# Patient Record
Sex: Female | Born: 1975 | Race: White | Hispanic: No | Marital: Married | Smoking: Never smoker
Health system: Southern US, Community
[De-identification: ages and names within clinical notes are randomized; demographics above are authoritative.]

## PROBLEM LIST (undated history)

## (undated) DIAGNOSIS — L309 Dermatitis, unspecified: Secondary | ICD-10-CM

## (undated) DIAGNOSIS — N83209 Unspecified ovarian cyst, unspecified side: Secondary | ICD-10-CM

## (undated) DIAGNOSIS — J45909 Unspecified asthma, uncomplicated: Secondary | ICD-10-CM

## (undated) DIAGNOSIS — N809 Endometriosis, unspecified: Secondary | ICD-10-CM

## (undated) DIAGNOSIS — Z87898 Personal history of other specified conditions: Secondary | ICD-10-CM

## (undated) DIAGNOSIS — N63 Unspecified lump in unspecified breast: Secondary | ICD-10-CM

## (undated) DIAGNOSIS — D219 Benign neoplasm of connective and other soft tissue, unspecified: Secondary | ICD-10-CM

## (undated) DIAGNOSIS — E079 Disorder of thyroid, unspecified: Secondary | ICD-10-CM

## (undated) HISTORY — DX: Personal history of other specified conditions: Z87.898

## (undated) HISTORY — DX: Endometriosis, unspecified: N80.9

## (undated) HISTORY — DX: Disorder of thyroid, unspecified: E07.9

## (undated) HISTORY — PX: LAPAROSCOPY: SHX197

## (undated) HISTORY — PX: MOUTH SURGERY: SHX715

## (undated) HISTORY — DX: Unspecified ovarian cyst, unspecified side: N83.209

## (undated) HISTORY — DX: Unspecified lump in unspecified breast: N63.0

## (undated) HISTORY — DX: Dermatitis, unspecified: L30.9

## (undated) HISTORY — PX: LEFT OOPHORECTOMY: SHX1961

## (undated) HISTORY — DX: Benign neoplasm of connective and other soft tissue, unspecified: D21.9

---

## 2001-07-17 ENCOUNTER — Other Ambulatory Visit: Admission: RE | Admit: 2001-07-17 | Discharge: 2001-07-17 | Payer: Self-pay | Admitting: Obstetrics and Gynecology

## 2004-09-22 ENCOUNTER — Other Ambulatory Visit: Admission: RE | Admit: 2004-09-22 | Discharge: 2004-09-22 | Payer: Self-pay | Admitting: Obstetrics and Gynecology

## 2005-03-11 ENCOUNTER — Encounter (INDEPENDENT_AMBULATORY_CARE_PROVIDER_SITE_OTHER): Payer: Self-pay | Admitting: *Deleted

## 2005-03-11 ENCOUNTER — Ambulatory Visit: Admission: RE | Admit: 2005-03-11 | Discharge: 2005-03-11 | Payer: Self-pay | Admitting: Obstetrics and Gynecology

## 2006-02-15 ENCOUNTER — Other Ambulatory Visit: Admission: RE | Admit: 2006-02-15 | Discharge: 2006-02-15 | Payer: Self-pay | Admitting: Obstetrics and Gynecology

## 2006-05-18 ENCOUNTER — Emergency Department (HOSPITAL_COMMUNITY): Admission: EM | Admit: 2006-05-18 | Discharge: 2006-05-18 | Payer: Self-pay | Admitting: Family Medicine

## 2006-08-18 ENCOUNTER — Inpatient Hospital Stay (HOSPITAL_COMMUNITY): Admission: AD | Admit: 2006-08-18 | Discharge: 2006-08-20 | Payer: Self-pay | Admitting: Obstetrics and Gynecology

## 2006-08-18 ENCOUNTER — Encounter (INDEPENDENT_AMBULATORY_CARE_PROVIDER_SITE_OTHER): Payer: Self-pay | Admitting: *Deleted

## 2007-02-28 ENCOUNTER — Other Ambulatory Visit: Admission: RE | Admit: 2007-02-28 | Discharge: 2007-02-28 | Payer: Self-pay | Admitting: Gynecology

## 2008-03-16 ENCOUNTER — Other Ambulatory Visit: Admission: RE | Admit: 2008-03-16 | Discharge: 2008-03-16 | Payer: Self-pay | Admitting: Gynecology

## 2009-01-05 ENCOUNTER — Inpatient Hospital Stay (HOSPITAL_COMMUNITY): Admission: RE | Admit: 2009-01-05 | Discharge: 2009-01-07 | Payer: Self-pay | Admitting: Obstetrics and Gynecology

## 2009-04-12 ENCOUNTER — Encounter: Admission: RE | Admit: 2009-04-12 | Discharge: 2009-04-12 | Payer: Self-pay | Admitting: Obstetrics and Gynecology

## 2009-09-03 ENCOUNTER — Encounter: Admission: RE | Admit: 2009-09-03 | Discharge: 2009-09-03 | Payer: Self-pay | Admitting: Obstetrics and Gynecology

## 2010-11-29 LAB — CBC
HCT: 37.5 % (ref 36.0–46.0)
HCT: 38.9 % (ref 36.0–46.0)
Hemoglobin: 13.7 g/dL (ref 12.0–15.0)
MCHC: 35.2 g/dL (ref 30.0–36.0)
Platelets: 173 10*3/uL (ref 150–400)
RBC: 4 MIL/uL (ref 3.87–5.11)
RDW: 12.8 % (ref 11.5–15.5)
WBC: 10.9 10*3/uL — ABNORMAL HIGH (ref 4.0–10.5)
WBC: 8.1 10*3/uL (ref 4.0–10.5)

## 2011-01-03 NOTE — Discharge Summary (Signed)
Stacy [name], Stacy [name]                 ACCOUNT NO.:  [id number]   MEDICAL RECORD NO.:  [id number]          PATIENT TYPE:  INP   LOCATION:  9131                          FACILITY:  [facility]   PHYSICIAN:  Huel Cote, M.D. DATE OF BIRTH:  May 31, 1976   DATE OF ADMISSION:  01/05/2009  DATE OF DISCHARGE:                               DISCHARGE SUMMARY   DISCHARGE DIAGNOSES:  1. Term pregnancy at 39 weeks, delivered.  2. Status post normal spontaneous vaginal delivery.   DISCHARGE MEDICATIONS:  1. Motrin 600 mg p.o. every 6 hours.  2. Percocet 1-2 tablets p.o. every 4 hours p.r.n.   DISCHARGE FOLLOWUP:  The patient is to follow up in the office in 6  weeks for her routine postpartum exam.   HOSPITAL COURSE:  The patient is 35 year old, G2, P1-0-0-1, who was  admitted at 47 plus weeks for induction of labor, given term status and  a favorable cervix.  Prenatal care was complicated by positive group B  strep status which was clindamycin resistant, but the patient was able  to tolerate cephalosporins despite her PENICILLIN allergy.  There were  no other prenatal issues.   Prenatal labs are as follows:  O positive, antibody negative, rubella  immune, hepatitis B surface antigen negative, HIV negative, GC negative,  chlamydia negative, RPR nonreactive, group B strep positive, one-hour  Glucola 81, first trimester screen normal.   PAST OBSTETRIC HISTORY:  In 2007, she had a vaginal delivery of a 7-  pound 15-ounce female infant.   PAST GYNECOLOGIC HISTORY:  She had 2 laparoscopies in 2006 and 2007 with  1 oophorectomy.   PAST MEDICAL HISTORY:  Asthma, allergies.   PAST SURGICAL HISTORY:  In 1984, she had a cyst removed from her  buttock, in 1997 was tooth removal, and in 2006 and 2007 laparoscopies  and LSO as stated.   Allergies are PENICILLIN and ERYTHROMYCIN.   On admission, she was afebrile with stable vital signs.  Fetal heart  rate was very reactive.  Cervix was 50, 3+, and a -2  station.  She had  rupture of membranes performed after she received her Ancef for group B  strep prophylaxis.  She progressed very quickly throughout the day,  reached complete dilation, and pushed great with a normal spontaneous  vaginal delivery of a vigorous female infant over a first-degree  laceration.  Apgars were 8 and 9, weight was 8 pounds 8 ounces.  There  was a nuchal cord x1 reduced over the head.  There was a small first-  degree laceration which was repaired with 2-0 Vicryl for hemostasis.  Estimated blood loss was 350 mL.  On postpartum day 1, the patient was  doing quite well.  Her only complaint were significant carpal tunnel  symptoms which had started towards the end of pregnancy and now seemed  to be slightly worse.  We discussed coping mechanism.  She was  already wrists splints.  Hemoglobin was 13.3.  Fundus was firm, bleeding  normal, and she requested an early discharge home, this was granted, and  the patient is to follow  up in our office in 6 weeks.  She may call if  her carpal tunnel symptoms do not improve in the first 2-3 weeks for  referral to a hand specialist.      Huel Cote, M.D.  Electronically Signed     KR/MEDQ  D:  01/06/2009  T:  01/06/2009  Job:  [id number]

## 2011-01-06 NOTE — Discharge Summary (Signed)
NAMENOE, PITTSLEY                 ACCOUNT NO.:  [id number]   MEDICAL RECORD NO.:  [id number]          PATIENT TYPE:  INP   LOCATION:  9105                          [facility]ACILITY:  [facility]   PHYSICIAN:  Malachi Pro. Ambrose Mantle, M.D. DATE OF BIRTH:  1975-11-04   DATE OF ADMISSION:  08/18/2006  DATE OF DISCHARGE:  08/20/2006                               DISCHARGE SUMMARY   This is 35 year old white married female para 0 gravida 1, EDC August 27, 2006 by ultrasound, admitted with severe left low-back pain and fetal  heart rate decelerations x1.  Blood group and type O positive with a  negative antibody, nonreactive serology, rubella immune, hepatitis B  surface antigen negative, HIV negative, GC and chlamydia negative.  One-  hour Glucola 113.  Group B strep negative.  Vaginal ultrasound on February 09, 2006:  Crown-rump length 4.67 cm, 11 weeks 4 days, [facility] August 27, 2006.  The patient declined screening.  Repeat ultrasound on April 02, 2006:  Average gestational age [redacted] weeks 6 days, [facility] August 28, 2006.  Prenatal care was uncomplicated.  At approximately 9:00 p.m. on the  night prior to admission, the patient had onset of left back pain just  above her left iliac crest.  Later, she was vomiting x6.  She came here,  and the fetal heart rate decelerated but then was normal.  Ultrasound  showed no abruption, and biophysical profile was 8/8.  The patient's  pain continued, so she was admitted.  She complained of contractions on  admission every 7-15 minutes.   Her past medical history revealed:  ALLERGIES TO PENICILLIN CAUSED A  RASH, E-MYCIN CAUSES GI UPSET.  Illnesses:  History of asthma.  Operations:  1984 cyst on her left buttock, 1997 wisdom teeth extracted,  2006 laparoscopy, 2007 laparoscopy with left ovary removed for  endometrioma.   PHYSICAL EXAMINATION ON ADMISSION:  Normal vital signs.  HEART/LUNGS:  Normal.  ABDOMEN:  Soft.  Fundal height 38 cm on August 15, 2006.  Fetal heart  tones  normal.  Cervix fingertip, 60%, vertex at a -2.  Artificial  rupture of the membranes produced blood tinged fluid.  Straight-leg raising test was negative bilaterally.   The patient was placed on Pitocin.  She was given heat to her low back.  Catheter urine showed no abnormalities of the urine.  By 9:48 a.m., the  Pitocin was at 6 milliunits a minute.  Contractions every 2-3 minutes.  She continued to vomit, and we checked her electrolytes which showed a  potassium of 3.5, chloride 106, carbon dioxide 21, sodium of 137.  By  Belinda Fisher, R.N., exam, the cervix was 3 cm, 100%, vertex at a -1, and  the patient requested an epidural.  The Pitocin was decreased to 4  milliunits a minute.  Cervix was 8 cm at 1:15 p.m., and by 2:18 p.m.,  the cervix was 7-8 cm.  The patient became fully dilated at 4:15 p.m.  She began pushing at 4:30 p.m., but bradycardia developed.  She was  tilted to her left side.  The  decelerations improved.  The vertex was  LOA.  The patient pushed well.  Fetal heart rate decelerated with each  contraction but had excellent recovery.  She delivered spontaneously ROA  over a second-degree midline laceration, right labial and right hymenal  lacerations by Dr. Ambrose Mantle a living female infant 7 pounds 15 ounces,  Apgars of 8 at one and 9 at five minutes.  There was mild shoulder  dystocia managed by McRoberts' and Teutsch-screw maneuvers.  Placenta was  intact.  Uterus normal.  Rectal negative.  Lacerations repaired with 2-0  and 3-0 Vicryl with 1% Xylocaine.  Blood loss about 500 mL.  Postpartum,  the patient did well and was discharged on the second postpartum day.  Initial hemoglobin 13.3, hematocrit 37.5, white count 11,700, platelet  count 195,000.  The follow-up hemoglobin was 10.6, hematocrit 30.0.  RPR  was nonreactive.  Catheter urine was negative except for 15 mg/dL  ketones.  Comprehensive metabolic profile showed no abnormalities except  alkaline phosphatase of 160,  total protein of 5.7, albumin of 2.9,  glucose of 68.   FINAL DIAGNOSES:  1. Intrauterine pregnancy at 38+ weeks delivered ROA.  2. Left back pain, unknown origin.  3. Nausea and vomiting, resolved.   OPERATIONS:  1. Spontaneous delivery ROA.  2. Repair of second-degree midline, right labial and right hymenal      lacerations.   FINAL CONDITION:  Improved.   INSTRUCTIONS:  Include our regular discharge instruction booklet.  Motrin 600 mg 30 tablets 1 every 6 hours as needed for pain is given at  discharge with 1 refill.      Malachi Pro. Ambrose Mantle, M.D.  Electronically Signed     TFH/MEDQ  D:  08/20/2006  T:  08/20/2006  Job:  [id number]

## 2011-01-06 NOTE — Op Note (Signed)
Stacy [name], Stacy [name]                 ACCOUNT NO.:  [id number]   MEDICAL RECORD NO.:  [id number]          PATIENT TYPE:  AMB   LOCATION:  DFTL                          [facility]ACILITY:  [facility]   PHYSICIAN:  Duke Salvia. Marcelle Overlie, M.D.DATE OF BIRTH:  [birth year]   DATE OF PROCEDURE:  03/11/2005  DATE OF DISCHARGE:                                 OPERATIVE REPORT   PREOPERATIVE DIAGNOSIS:  Ovarian cyst, probable endometrioma, pelvic pain.   POSTOPERATIVE DIAGNOSIS:  Pelvic endometriosis, left endometrioma.   OPERATION/PROCEDURE:  1.  Diagnostic laparoscopy with left ovarian cystotomy.  2.  Drainage of left endometrioma.  3.  Coagulation of areas of endometriosis.   SURGEON:  Duke Salvia. Marcelle Overlie, M.D.   ANESTHESIA:  General endotracheal anesthesia.   COMPLICATIONS:  None.   DRAINS:  In and out Foley catheter.   ESTIMATED BLOOD LOSS:  Minimal.   SPECIMENS:  Left tubal hydatid cyst.   DESCRIPTION OF PROCEDURE:  The patient was taken to the operating room .  General endotracheal anesthesia was obtained with the patient's legs in  stirrups, the abdomen, perineum and vagina prepped and draped in the usual  manner for laparoscopy.  Bladder was drained.  EUA carried out.  Uterus was  mid position, normal size. There was an 8 cm left ovarian cyst palpable.  Hulka tenaculum was positioned and attention directed to the abdomen where a  2 cm subumbilical incision was made after infiltrating with 0.5% Marcaine  plain.  Small incision was made and the Veress needle was introduced without  difficulty.  A central abdominal position was verified by pressure and water  testing.  After 2.5 L pneumoperitoneum was then created, laparoscopic trocar  and sleeve were introduced without difficulty.  Three fingerbreadths above  the symphysis from the midline, a 5 mm trocar was inserted after  infiltration with Marcaine.  The patient was placed in Trendelenburg and the  pelvic findings were as follows:   The uterus  itself was normal size.  In the anterior space, there was  significant areas of diffuse endometriosis. The right ovary was normal  except for some superficial endometriosis as was the right tube.  The areas  along the surface of the ovary were coagulated with bipolar.  The left tube  was normal with a normal fimbriated end except for a small hydatid cyst  which was coagulated and removed.  The left ovary was enlarged with an 8 cm  smooth-walled cyst.  It was not adherent to the cul-de-sac.  On elevation  there were some areas of endometriosis above the uterosacral ligaments  behind the uterus well defined.  After coagulating the surface of the left  ovary, small incision was made releasing dark blood. This was not quite a  chocolate cyst but it was felt to represent either a corpus luteum cyst with  old blood or endometrioma.  This was deflated and aspirated completely.  The  suction irrigator was then placed inside the incision to irrigate the cyst  thoroughly.  The cyst was too large to effectively strip the cavity but she  will require postoperative  Lupron regardless.  After this was completed, the  right ovarian superficial endometriosis was coagulated.  The cyst on the  left was completely deflated and was hemostatic.  Pelvis was irrigated  thoroughly and aspirated and 30-50 mL of saline was floated in the cul-de-  sac in an attempt to try prevent adhesions.  Instruments were removed, gas  allowed to escape.  The deep fascia was closed with 4-0 Dexon subcuticular  sutures and Dermabond.  She tolerated this well and went to the recovery  room in good condition.       RMH/MEDQ  D:  03/11/2005  T:  03/11/2005  Job:  [id number]

## 2011-01-06 NOTE — H&P (Signed)
NAMEMELVINA, PANGELINAN                 ACCOUNT NO.:  [id number]   MEDICAL RECORD NO.:  [id number]           PATIENT TYPE:   LOCATION:                                 FACILITY:   PHYSICIAN:  Duke Salvia. Marcelle Overlie, M.D.    DATE OF BIRTH:   DATE OF ADMISSION:  03/11/2005  DATE OF DISCHARGE:                                HISTORY & PHYSICAL   CHIEF COMPLAINT:  Severe pelvic pain.   HISTORY OF PRESENT ILLNESS:  A 35 year old G0, P0.  Patient currently using  condoms for contraception.  I saw this patient originally in 1996 at which  time she was 18, having irregular cycles, and was started on Lo-Estrin at  that point.  She was seen again in 1999 as a college student, doing well on  her OCPs and again in 2002 on Lo-Ovral, doing well.   I had not seen her in four years.  She was under the care of Dr. 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  When I  saw her September 22, 2004 she had come off of her Yasmin and was ready to try  to get pregnant at that time.  She was told that she may have some degree of  PCOS and may require Clomid for ovulation induction.  There is no family  history of endometriosis.  Laboratory work done September 22, 2004 showed  normal thyroid profile, FSH 4.1, progesterone 7.8, prolactin 12.8.  That was  a mid luteal progesterone.  She was ovulating at that time.   She saw her PCP, Dr. Stacie Acres, last month complaining of severe pelvic pain and  a CT was ordered that was dated February 22, 2005 that showed a multiloculated  pelvic cyst, most likely hemorrhagic cyst or endometrioma.  There was no  free fluid noted.  At that time the appendix was identified and appeared to  be normal.  There was no other CT evidence of appendicitis and her white  count was 6000.  She was given narcotic pain medicines with the plan to  follow up ultrasound after her next cycle to see if this resolved.   In the interim her pain has worsened so a follow-up  ultrasound was done March 09, 2005 that showed several small follicle cysts in the right ovary.  Left  ovary, however, was enlarged 8.1 x 5.9 x 4.8 with some diffuse internal  echoes, appearing to be more of an endometrial or hemorrhagic CLC versus  other possibilities such as cystadenoma or a dermoid.  Because of the  severity of the pain she presents now for a diagnostic laparoscopy with  possible laparotomy.  This procedure was discussed at great length with her  including the need to remain conservative because of her pregnancy concerns.  Specifically, will start with laparoscopy.  If significant endometriosis is  encountered we may be able to drain the endometrioma laparoscopically and  treat her with postoperative Lupron.  If the cyst is more self-contained  such as dermoid and does not appear to be adherent,  we discussed laparotomy  with cystectomy versus the possibility of oophorectomy which she  understands.  This procedure including risk of bleeding, infection,  transfusion, adjacent organ injury, the possible need for open or additional  surgery along with impact on her fertility, and expected recovery time all  reviewed with her which she understands and accepts.   PAST MEDICAL HISTORY:   ALLERGIES:  PENICILLIN, ERYTHROMYCIN.   CURRENT MEDICATIONS:  Vicoprofen or Motrin p.r.n.   FAMILY HISTORY:  Significant for mother with mitral valve prolapse, a  brother with asthma, otherwise unremarkable.   PHYSICAL EXAMINATION:  VITAL SIGNS:  Temperature 98.6, blood pressure  120/72.  HEENT:  Unremarkable.  NECK:  Supple without masses.  LUNGS:  Clear.  CARDIOVASCULAR:  Regular rate and rhythm without murmurs, rubs, or gallops  noted.  BREASTS:  Without masses.  ABDOMEN:  Soft, flat, nontender.  No rebound.  Normal bowel sounds.  PELVIC:  Vulva, vagina, cervix was normal.  Last Pap dated February 2006 was  normal.  Uterus itself was anterior, normal size, nontender.  The right   adnexal area is unremarkable.  Posterior and to the left was a moderately  tender ovarian cyst as noted by ultrasound.  No unusual nodularity.  EXTREMITIES:  Unremarkable.  NEUROLOGIC:  Unremarkable.   IMPRESSION:  Ovarian cyst, acute pelvic pain.   PLAN:  Laparoscopy with possible laparotomy and USO as discussed above.  Will also plan for preoperative bowel prep which was discussed with her.       RMH/MEDQ  D:  03/09/2005  T:  03/09/2005  Job:  [id number]

## 2011-07-21 ENCOUNTER — Other Ambulatory Visit (HOSPITAL_COMMUNITY): Payer: Self-pay | Admitting: Gynecology

## 2011-07-21 DIAGNOSIS — N979 Female infertility, unspecified: Secondary | ICD-10-CM

## 2011-07-27 ENCOUNTER — Ambulatory Visit (HOSPITAL_COMMUNITY)
Admission: RE | Admit: 2011-07-27 | Discharge: 2011-07-27 | Disposition: A | Discharge status: Home / Self Care | Payer: BC Managed Care – PPO | Source: Ambulatory Visit | Attending: Gynecology | Admitting: Gynecology

## 2011-07-27 DIAGNOSIS — N979 Female infertility, unspecified: Secondary | ICD-10-CM

## 2011-07-27 MED ORDER — IOHEXOL 300 MG/ML  SOLN: 20.0000 mL | Freq: Once | INTRAMUSCULAR | Status: AC | PRN

## 2011-08-22 NOTE — L&D Delivery Note (Signed)
Delivery Note At 10:01 AM a viable female was delivered via Vaginal, Spontaneous Delivery (Presentation: ; Occiput Anterior, to LOT).  APGAR: 8, 9; weight P .   Placenta status: Intact, Spontaneous.  Cord: 3 vessels with the following complications: None.  Baby delivered with mild, <30seconds shoulder dystocia, relieved with McRoberts, Suprapubic, Posterior arm manipulation  Anesthesia: Epidural  Episiotomy: None Lacerations: 1st degree;Perineal Suture Repair: 3.0 vicryl rapide Est. Blood Loss (mL): 400  Mom to postpartum.  Baby to stay with mommy and daddy.  [name],Stacy Getchell 06/19/2012, 10:22 AM  A+/ BR/ RI/

## 2011-12-15 LAB — OB RESULTS CONSOLE GBS: GBS: POSITIVE

## 2011-12-15 LAB — OB RESULTS CONSOLE ABO/RH: "RH Type ": POSITIVE

## 2011-12-15 LAB — OB RESULTS CONSOLE ANTIBODY SCREEN: Antibody Screen: NEGATIVE

## 2011-12-15 LAB — OB RESULTS CONSOLE HIV ANTIBODY (ROUTINE TESTING): HIV: NONREACTIVE

## 2011-12-15 LAB — OB RESULTS CONSOLE RUBELLA ANTIBODY, IGM: Rubella: IMMUNE

## 2011-12-15 LAB — OB RESULTS CONSOLE GC/CHLAMYDIA: Chlamydia: NEGATIVE

## 2012-02-15 ENCOUNTER — Other Ambulatory Visit: Payer: Self-pay | Admitting: Obstetrics and Gynecology

## 2012-02-15 DIAGNOSIS — N63 Unspecified lump in unspecified breast: Secondary | ICD-10-CM

## 2012-02-16 ENCOUNTER — Ambulatory Visit
Admission: RE | Admit: 2012-02-16 | Discharge: 2012-02-16 | Disposition: A | Discharge status: Home / Self Care | Payer: BC Managed Care – PPO | Source: Ambulatory Visit | Attending: Obstetrics and Gynecology | Admitting: Obstetrics and Gynecology

## 2012-02-16 DIAGNOSIS — N63 Unspecified lump in unspecified breast: Secondary | ICD-10-CM

## 2012-06-11 ENCOUNTER — Encounter (HOSPITAL_COMMUNITY): Payer: Self-pay | Admitting: *Deleted

## 2012-06-11 ENCOUNTER — Telehealth (HOSPITAL_COMMUNITY): Payer: Self-pay | Admitting: *Deleted

## 2012-06-11 NOTE — Telephone Encounter (Signed)
Preadmission screen  

## 2012-06-14 ENCOUNTER — Inpatient Hospital Stay (HOSPITAL_COMMUNITY)
Admission: AD | Admit: 2012-06-14 | Discharge: 2012-06-15 | Disposition: A | Discharge status: Home / Self Care | Payer: BC Managed Care – PPO | Source: Ambulatory Visit | Attending: Obstetrics and Gynecology | Admitting: Obstetrics and Gynecology

## 2012-06-14 ENCOUNTER — Telehealth (HOSPITAL_COMMUNITY): Payer: Self-pay | Admitting: *Deleted

## 2012-06-14 ENCOUNTER — Encounter (HOSPITAL_COMMUNITY): Payer: Self-pay | Admitting: *Deleted

## 2012-06-14 ENCOUNTER — Encounter (HOSPITAL_COMMUNITY): Payer: Self-pay

## 2012-06-14 DIAGNOSIS — O479 False labor, unspecified: Secondary | ICD-10-CM | POA: Insufficient documentation

## 2012-06-14 HISTORY — DX: Unspecified asthma, uncomplicated: J45.909

## 2012-06-14 NOTE — Telephone Encounter (Signed)
Preadmission screen  

## 2012-06-14 NOTE — Progress Notes (Signed)
Dr Ellyn Hack notified of patient, tracing, ctx pattern, sve result. Order to offer patient to ambulate for an hour and recheck or discharge home.

## 2012-06-19 ENCOUNTER — Encounter (HOSPITAL_COMMUNITY): Payer: Self-pay | Admitting: Anesthesiology

## 2012-06-19 ENCOUNTER — Encounter (HOSPITAL_COMMUNITY): Payer: Self-pay | Admitting: *Deleted

## 2012-06-19 ENCOUNTER — Inpatient Hospital Stay (HOSPITAL_COMMUNITY)
Admission: AD | Admit: 2012-06-19 | Discharge: 2012-06-21 | DRG: 373 | Disposition: A | Discharge status: Home / Self Care | Payer: BC Managed Care – PPO | Source: Ambulatory Visit | Attending: Obstetrics and Gynecology | Admitting: Obstetrics and Gynecology

## 2012-06-19 ENCOUNTER — Inpatient Hospital Stay (HOSPITAL_COMMUNITY): Payer: BC Managed Care – PPO | Admitting: Anesthesiology

## 2012-06-19 DIAGNOSIS — O99892 Other specified diseases and conditions complicating childbirth: Secondary | ICD-10-CM | POA: Diagnosis present

## 2012-06-19 DIAGNOSIS — Z3490 Encounter for supervision of normal pregnancy, unspecified, unspecified trimester: Secondary | ICD-10-CM

## 2012-06-19 DIAGNOSIS — Z22330 Carrier of Group B streptococcus: Secondary | ICD-10-CM

## 2012-06-19 LAB — CBC
Hemoglobin: 13.5 g/dL (ref 12.0–15.0)
RBC: 4.3 MIL/uL (ref 3.87–5.11)
WBC: 11.7 10*3/uL — ABNORMAL HIGH (ref 4.0–10.5)

## 2012-06-19 LAB — ABO/RH: ABO/RH(D): O POS

## 2012-06-19 LAB — RPR: RPR Ser Ql: NONREACTIVE

## 2012-06-19 MED ORDER — DIPHENHYDRAMINE HCL 50 MG/ML IJ SOLN: 12.5000 mg | INTRAMUSCULAR | Status: DC | PRN

## 2012-06-19 MED ORDER — PRENATAL MULTIVITAMIN CH
1.0000 | ORAL_TABLET | Freq: Every day | ORAL | Status: DC
  Administered 2012-06-19: 1 via ORAL

## 2012-06-19 MED ORDER — WITCH HAZEL-GLYCERIN EX PADS: 1.0000 "application " | MEDICATED_PAD | CUTANEOUS | Status: DC | PRN

## 2012-06-19 MED ORDER — LACTATED RINGERS IV SOLN: INTRAVENOUS | Status: DC

## 2012-06-19 MED ORDER — ONDANSETRON HCL 4 MG PO TABS
4.0000 mg | ORAL_TABLET | ORAL | Status: DC | PRN
Start: 2012-06-19 — End: 2012-06-21

## 2012-06-19 MED ORDER — LIDOCAINE HCL (PF) 1 % IJ SOLN
INTRAMUSCULAR | Status: DC | PRN
  Administered 2012-06-19 (×2): 5 mL

## 2012-06-19 MED ORDER — DIBUCAINE 1 % RE OINT: 1.0000 "application " | TOPICAL_OINTMENT | RECTAL | Status: DC | PRN

## 2012-06-19 MED ORDER — IBUPROFEN 600 MG PO TABS
600.0000 mg | ORAL_TABLET | Freq: Four times a day (QID) | ORAL | Status: DC
  Administered 2012-06-19 – 2012-06-21 (×8): 600 mg via ORAL
  Filled 2012-06-19 (×8): qty 1

## 2012-06-19 MED ORDER — OXYTOCIN 40 UNITS IN LACTATED RINGERS INFUSION - SIMPLE MED
62.5000 mL/h | INTRAVENOUS | Status: DC
  Administered 2012-06-19: 62.5 mL/h via INTRAVENOUS
  Administered 2012-06-19: 999 mL/h via INTRAVENOUS
  Filled 2012-06-19: qty 1000

## 2012-06-19 MED ORDER — CITRIC ACID-SODIUM CITRATE 334-500 MG/5ML PO SOLN: 30.0000 mL | ORAL | Status: DC | PRN

## 2012-06-19 MED ORDER — PRENATAL MULTIVITAMIN CH
1.0000 | ORAL_TABLET | Freq: Every day | ORAL | Status: DC
  Administered 2012-06-19 – 2012-06-21 (×3): 1 via ORAL
  Filled 2012-06-19 (×3): qty 1

## 2012-06-19 MED ORDER — EPHEDRINE 5 MG/ML INJ
INTRAVENOUS | Status: AC
  Filled 2012-06-19: qty 4

## 2012-06-19 MED ORDER — SIMETHICONE 80 MG PO CHEW: 80.0000 mg | CHEWABLE_TABLET | ORAL | Status: DC | PRN

## 2012-06-19 MED ORDER — OXYCODONE-ACETAMINOPHEN 5-325 MG PO TABS: 1.0000 | ORAL_TABLET | ORAL | Status: DC | PRN

## 2012-06-19 MED ORDER — BENZOCAINE-MENTHOL 20-0.5 % EX AERO: 1.0000 "application " | INHALATION_SPRAY | CUTANEOUS | Status: DC | PRN

## 2012-06-19 MED ORDER — IBUPROFEN 600 MG PO TABS: 600.0000 mg | ORAL_TABLET | Freq: Four times a day (QID) | ORAL | Status: DC | PRN

## 2012-06-19 MED ORDER — PHENYLEPHRINE 40 MCG/ML (10ML) SYRINGE FOR IV PUSH (FOR BLOOD PRESSURE SUPPORT)
PREFILLED_SYRINGE | INTRAVENOUS | Status: AC
  Filled 2012-06-19: qty 5

## 2012-06-19 MED ORDER — ONDANSETRON HCL 4 MG/2ML IJ SOLN: 4.0000 mg | Freq: Four times a day (QID) | INTRAMUSCULAR | Status: DC | PRN

## 2012-06-19 MED ORDER — ZOLPIDEM TARTRATE 5 MG PO TABS
5.0000 mg | ORAL_TABLET | Freq: Every evening | ORAL | Status: DC | PRN
Start: 2012-06-19 — End: 2012-06-21

## 2012-06-19 MED ORDER — EPHEDRINE 5 MG/ML INJ: 10.0000 mg | INTRAVENOUS | Status: DC | PRN

## 2012-06-19 MED ORDER — MUPIROCIN CALCIUM 2 % NA OINT
1.0000 "application " | TOPICAL_OINTMENT | NASAL | Status: DC
  Filled 2012-06-19 (×2): qty 1

## 2012-06-19 MED ORDER — PHENYLEPHRINE 40 MCG/ML (10ML) SYRINGE FOR IV PUSH (FOR BLOOD PRESSURE SUPPORT): 80.0000 ug | PREFILLED_SYRINGE | INTRAVENOUS | Status: DC | PRN

## 2012-06-19 MED ORDER — CLINDAMYCIN PHOSPHATE 900 MG/50ML IV SOLN
900.0000 mg | Freq: Once | INTRAVENOUS | Status: AC
  Administered 2012-06-19: 900 mg via INTRAVENOUS
  Filled 2012-06-19: qty 50

## 2012-06-19 MED ORDER — LANOLIN HYDROUS EX OINT: TOPICAL_OINTMENT | CUTANEOUS | Status: DC | PRN

## 2012-06-19 MED ORDER — FENTANYL 2.5 MCG/ML BUPIVACAINE 1/10 % EPIDURAL INFUSION (WH - ANES)
14.0000 mL/h | INTRAMUSCULAR | Status: DC
  Administered 2012-06-19: 14 mL/h via EPIDURAL
  Filled 2012-06-19: qty 125

## 2012-06-19 MED ORDER — ONDANSETRON HCL 4 MG/2ML IJ SOLN: 4.0000 mg | INTRAMUSCULAR | Status: DC | PRN

## 2012-06-19 MED ORDER — SENNOSIDES-DOCUSATE SODIUM 8.6-50 MG PO TABS
2.0000 | ORAL_TABLET | Freq: Every day | ORAL | Status: DC
  Administered 2012-06-19 – 2012-06-20 (×2): 2 via ORAL

## 2012-06-19 MED ORDER — LACTATED RINGERS IV SOLN: 500.0000 mL | Freq: Once | INTRAVENOUS | Status: DC

## 2012-06-19 MED ORDER — FENTANYL 2.5 MCG/ML BUPIVACAINE 1/10 % EPIDURAL INFUSION (WH - ANES)
INTRAMUSCULAR | Status: AC
  Filled 2012-06-19: qty 125

## 2012-06-19 MED ORDER — LEVOTHYROXINE SODIUM 25 MCG PO TABS: 25.0000 ug | ORAL_TABLET | Freq: Every day | ORAL | Status: DC

## 2012-06-19 MED ORDER — LACTATED RINGERS IV SOLN: 500.0000 mL | INTRAVENOUS | Status: DC | PRN

## 2012-06-19 MED ORDER — ACETAMINOPHEN 325 MG PO TABS: 650.0000 mg | ORAL_TABLET | ORAL | Status: DC | PRN

## 2012-06-19 MED ORDER — LIDOCAINE HCL (PF) 1 % IJ SOLN
30.0000 mL | INTRAMUSCULAR | Status: DC | PRN
  Filled 2012-06-19: qty 30

## 2012-06-19 MED ORDER — OXYTOCIN BOLUS FROM INFUSION
500.0000 mL | Freq: Once | INTRAVENOUS | Status: DC
  Filled 2012-06-19: qty 500

## 2012-06-19 MED ORDER — DIPHENHYDRAMINE HCL 25 MG PO CAPS: 25.0000 mg | ORAL_CAPSULE | Freq: Four times a day (QID) | ORAL | Status: DC | PRN

## 2012-06-19 NOTE — Anesthesia Preprocedure Evaluation (Signed)
Anesthesia Evaluation  Patient identified by MRN, date of birth, ID band Patient awake    Reviewed: Allergy & Precautions, H&P , Patient's Chart, lab work & pertinent test results  Airway Mallampati: II TM Distance: >3 FB Neck ROM: full    Dental No notable dental hx.    Pulmonary neg pulmonary ROS, asthma ,  breath sounds clear to auscultation  Pulmonary exam normal       Cardiovascular negative cardio ROS  Rhythm:regular Rate:Normal     Neuro/Psych negative neurological ROS  negative psych ROS   GI/Hepatic negative GI ROS, Neg liver ROS,   Endo/Other  negative endocrine ROS  Renal/GU negative Renal ROS     Musculoskeletal   Abdominal   Peds  Hematology negative hematology ROS (+)   Anesthesia Other Findings Eczema     Fibroid   endometriomas x3    Endometriosis     Hx of mastitis        Breast mass   under R arm benign [place] x3 still there Ovarian cyst        Thyroid disease   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     Plan Discussed with:   Anesthesia Plan Comments:         Anesthesia Quick Evaluation

## 2012-06-19 NOTE — Anesthesia Procedure Notes (Signed)
Epidural Patient location during procedure: OB Start time: 06/19/2012 9:41 AM  Staffing Anesthesiologist: Brayton Caves R Performed by: anesthesiologist   Preanesthetic Checklist Completed: patient identified, site marked, surgical consent, pre-op evaluation, timeout performed, IV checked, risks and benefits discussed and monitors and equipment checked  Epidural Patient position: sitting Prep: site prepped and draped and DuraPrep Patient monitoring: continuous pulse ox and blood pressure Approach: midline Injection technique: LOR air and LOR saline  Needle:  Needle type: Tuohy  Needle gauge: 17 G Needle length: 9 cm and 9 Needle insertion depth: 5 cm cm Catheter type: closed end flexible Catheter size: 19 Gauge Catheter at skin depth: 10 cm Test dose: negative  Assessment Events: blood not aspirated, injection not painful, no injection resistance, negative IV test and no paresthesia  Additional Notes Patient identified.  Risk benefits discussed including failed block, incomplete pain control, headache, nerve damage, paralysis, blood pressure changes, nausea, vomiting, reactions to medication both toxic or allergic, and postpartum back pain.  Patient expressed understanding and wished to proceed.  All questions were answered.  Sterile technique used throughout procedure and epidural site dressed with sterile barrier dressing. No paresthesia or other complications noted.The patient did not experience any signs of intravascular injection such as tinnitus or metallic taste in mouth nor signs of intrathecal spread such as rapid motor block. Please see nursing notes for vital signs.

## 2012-06-19 NOTE — MAU Note (Signed)
Pt states U/C's started to get more intense about 0445 this am.  No Vaginal bleeding or ROM.  Good fetal movement.

## 2012-06-19 NOTE — H&P (Signed)
Stacy [name] is a 36 y.o. female G3P2002 at 39+ in active labor, presents in advanced labor.  GBBS + will get prophylaxis.  Will attempt to place epidural.  Uncomplicated PNC except Carpal tunnel and GBBS+ Maternal Medical History:  Reason for admission: Reason for admission: contractions.  Contractions: Frequency: regular.    Fetal activity: Perceived fetal activity is normal.      OB History    Grav Para Term Preterm Abortions TAB SAB Ect Mult Living   3 2 2       2     G1 TSVD 7#15, G2 TSVD 8#8, G3 present; no abnormal pap, no STDs Past Medical History  Diagnosis Date  . Eczema   . Fibroid     endometriomas x3  . Endometriosis   . Hx of mastitis   . Breast mass     under R arm benign [place] x3 still there  . Ovarian cyst   . 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 Asthma    Past Surgical History  Procedure Date  . Mouth surgery   . Laparoscopy   . Left oophorectomy    Family History: family history includes Alcohol abuse in her brother, maternal grandfather, paternal grandfather, and paternal uncle; COPD in her paternal grandmother; Cancer in her paternal grandmother; Diverticulitis in her maternal grandfather; Endometriosis in her maternal aunt; Fibroids in her paternal grandmother; Heart disease in her maternal grandfather; Hyperlipidemia in her mother; Osteopenia in her mother; Other in her father; and Parkinson's disease in her maternal grandfather. Social History:  reports that she has never smoked. She has never used smokeless tobacco. She reports that she does not drink alcohol or use illicit drugs. Doctor, general practice, married Meds Claritin, PNV, Synthroid All PCN, Erythromycin, no latex    Prenatal Transfer Tool  Maternal Diabetes: No Genetic Screening: Normal Maternal Ultrasounds/Referrals: Normal Fetal Ultrasounds or other Referrals:  None Maternal Substance Abuse:  No Significant Maternal Medications:  Meds include: Other: PNV, Claritin,  Synthroid Significant Maternal Lab Results:  Lab values include: Group B Strep positive Other Comments:  maternal hypothyroid  Review of Systems  Constitutional: Negative.   HENT: Negative.   Eyes: Negative.   Respiratory: Negative.   Cardiovascular: Negative.   Gastrointestinal: Negative.   Genitourinary: Negative.   Musculoskeletal: Negative.   Skin: Negative.   Neurological: Negative.   Psychiatric/Behavioral: Negative.     Dilation: 8 Effacement (%): 90 Station: -1 Exam by:: L. Paschal, RN Blood pressure 115/79, pulse 71, temperature 97.7 F (36.5 C), temperature source Oral, resp. rate 18, height 5\' 3"  (1.6 m), weight 69.4 kg (153 lb), last menstrual period 09/17/2011, SpO2 100.00%. Maternal Exam:  Uterine Assessment: Contraction strength is firm.  Contraction frequency is regular.   Abdomen: Fundal height is appropriate for gestation.   Estimated fetal weight is 8-9#.   Fetal presentation: vertex  Introitus: Normal vulva. Normal vagina.  Pelvis: adequate for delivery.      Physical Exam  Constitutional: She is oriented to person, place, and time. She appears well-developed and well-nourished.  HENT:  Head: Normocephalic.  Neck: Normal range of motion. Neck supple.  Cardiovascular: Normal rate and regular rhythm.   Respiratory: Effort normal and breath sounds normal. No respiratory distress.  GI: Soft. Bowel sounds are normal. There is no tenderness.  Musculoskeletal: Normal range of motion.  Neurological: She is alert and oriented to person, place, and time.  Skin: Skin is warm and dry.  Psychiatric: She has a normal mood  and affect. Her behavior is normal.    Prenatal labs: ABO, Rh: O/Positive/-- (04/26 0000) Antibody: Negative (04/26 0000) Rubella: Immune (04/26 0000) RPR: Nonreactive (04/26 0000)  HBsAg: Negative (04/26 0000)  HIV: Non-reactive (04/26 0000)  GBS: Positive (04/26 0000)  Hgb 13.5/ Pap WNL HR HPV neg/ Plt 237K/ GC neg/ Chl neg/ First Tri  and AFP WNL/ glucola 96/ TSH WNL/ GBBS +  U/S cwd, nl NT, post plac. Nl anat, post plac, female  Tdap/Flu - 9/25  Assessment/Plan: 16XW [id number] with advanced cervical dilitation/ advanced labor Admit, Clinda for gbbs prophylaxis Try for epidural Expect SVD   BOVARD,Shloime Keilman 06/19/2012, 8:40 AM

## 2012-06-19 NOTE — MAU Note (Signed)
Patient states she is having contractions every 3 minutes. Has had spotting a few days ago, no actively bleeding or leaking.

## 2012-06-20 LAB — CBC
Platelets: 163 10*3/uL (ref 150–400)
RDW: 11.9 % (ref 11.5–15.5)
WBC: 10.2 10*3/uL (ref 4.0–10.5)

## 2012-06-20 MED ORDER — LEVOTHYROXINE SODIUM 25 MCG PO TABS
25.0000 ug | ORAL_TABLET | Freq: Every day | ORAL | Status: DC
  Administered 2012-06-21: 25 ug via ORAL
  Filled 2012-06-20: qty 1

## 2012-06-20 NOTE — Anesthesia Postprocedure Evaluation (Signed)
  Anesthesia Post-op Note  Patient: Stacy [name]  Procedure(s) Performed: * No procedures listed *  Patient Location: Mother/Baby  Anesthesia Type:Epidural  Level of Consciousness: awake, alert  and oriented  Airway and Oxygen Therapy: Patient Spontanous Breathing  Post-op Pain: none  Post-op Assessment: Patient's Cardiovascular Status Stable, Respiratory Function Stable, Patent Airway, No signs of Nausea or vomiting, Adequate PO intake, Pain level controlled, No headache, No backache, No residual numbness and No residual motor weakness  Post-op Vital Signs: Reviewed and stable  Complications: No apparent anesthesia complications

## 2012-06-20 NOTE — Progress Notes (Signed)
Post Partum Day 1 Subjective: no complaints, voiding, tolerating PO and nl lochia, pain controlled  Objective: Blood pressure 120/78, pulse 66, temperature 98.2 F (36.8 C), temperature source Oral, resp. rate 16, height 5\' 3"  (1.6 m), weight 69.4 kg (153 lb), last menstrual period 09/17/2011, SpO2 99.00%, unknown if currently breastfeeding.  Physical Exam:  General: alert and no distress Lochia: appropriate Uterine Fundus: firm   Basename 06/20/12 0515 06/19/12 0835  HGB 12.3 13.5  HCT 34.9* 38.3    Assessment/Plan: Plan for discharge tomorrow, Breastfeeding and Lactation consult   LOS: 1 day   BOVARD,Tyriq Moragne 06/20/2012, 8:41 AM

## 2012-06-21 ENCOUNTER — Inpatient Hospital Stay (HOSPITAL_COMMUNITY): Admission: RE | Admit: 2012-06-21 | Payer: BC Managed Care – PPO | Source: Ambulatory Visit

## 2012-06-21 MED ORDER — PRENATAL MULTIVITAMIN CH: 1.0000 | ORAL_TABLET | Freq: Every day | ORAL | Status: DC

## 2012-06-21 MED ORDER — IBUPROFEN 800 MG PO TABS: 800.0000 mg | ORAL_TABLET | Freq: Three times a day (TID) | ORAL | Status: AC | PRN

## 2012-06-21 MED ORDER — OXYCODONE-ACETAMINOPHEN 5-325 MG PO TABS: 1.0000 | ORAL_TABLET | Freq: Four times a day (QID) | ORAL | Status: DC | PRN

## 2012-06-21 NOTE — Discharge Summary (Signed)
Obstetric Discharge Summary Reason for Admission: onset of labor Prenatal Procedures: none Intrapartum Procedures: spontaneous vaginal delivery, mild shoulder dystocia Postpartum Procedures: none Complications-Operative and Postpartum: 1st degree perineal laceration Hemoglobin  Date Value Range Status  06/20/2012 12.3  12.0 - 15.0 g/dL Final     HCT  Date Value Range Status  06/20/2012 34.9* 36.0 - 46.0 % Final    Physical Exam:  General: alert and no distress Lochia: appropriate Uterine Fundus: firm  Discharge Diagnoses: Term Pregnancy-delivered  Discharge Information: Date: 06/21/2012 Activity: pelvic rest Diet: routine Medications: PNV, Ibuprofen and Percocet Condition: improved Instructions: refer to practice specific booklet Discharge to: home Follow-up Information    Follow up with BOVARD,Merlon Alcorta, MD. Schedule an appointment as soon as possible for a visit in 6 weeks.   Contact information:   [address] [place] [address] [phone]          Newborn Data: Live born female  Birth Weight: 8 lb 10.3 oz (3920 g) APGAR: 8, 9  Home with mother.  BOVARD,Dermot Gremillion 06/21/2012, 8:59 AM

## 2012-06-21 NOTE — Progress Notes (Signed)
Post Partum Day 2 Subjective: no complaints, voiding, tolerating PO and nl lochia, pain controlled  Objective: Blood pressure 103/67, pulse 68, temperature 97.5 F (36.4 C), temperature source Oral, resp. rate 16, height 5\' 3"  (1.6 m), weight 69.4 kg (153 lb), last menstrual period 09/17/2011, SpO2 97.00%, unknown if currently breastfeeding.  Physical Exam:  General: alert and no distress Lochia: appropriate Uterine Fundus: firm   Basename 06/20/12 0515 06/19/12 0835  HGB 12.3 13.5  HCT 34.9* 38.3    Assessment/Plan: Discharge home, Breastfeeding and Lactation consult, d/c with motrin, percocet and pnv, f/u 6 weeks   LOS: 2 days   BOVARD,Journe Hallmark 06/21/2012, 8:53 AM

## 2012-06-24 ENCOUNTER — Inpatient Hospital Stay (HOSPITAL_COMMUNITY): Payer: BC Managed Care – PPO

## 2013-10-05 IMAGING — RF DG HYSTEROGRAM
8 series · 8 of 8 positions shown · non-contrast
Comparison: none

CLINICAL DATA: Infertility.  Assess tubal patency

HYSTEROSALPINGOGRAM
TECHNIQUE: Hysterosalpingogram was performed by the ordering
physician under fluoroscopy.  Fluoroscopic images are submitted for
interpretation following the procedure.
Fluoroscopy Time:  2.0 minutes.

[Series 1: run · 1 of 1 slices shown (1 of 8)]
[im 1/1]
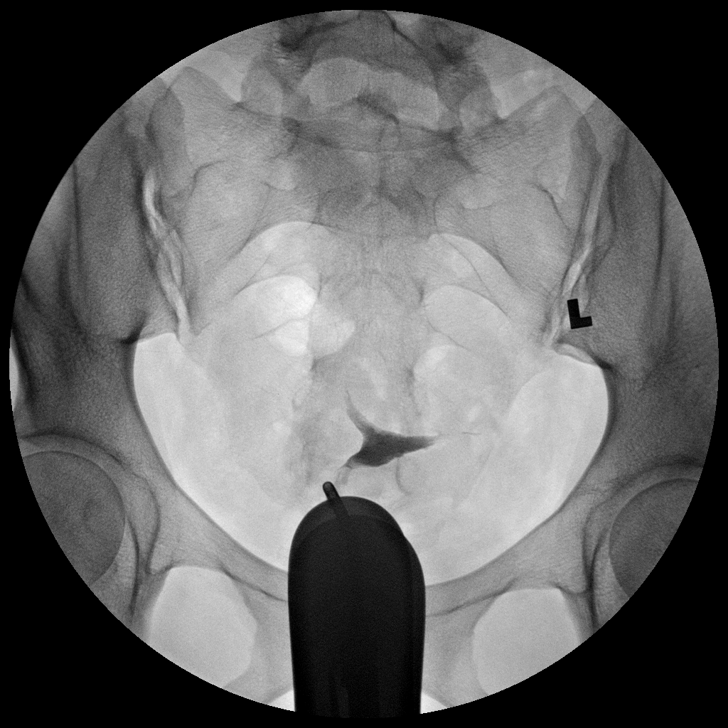

[Series 2: run · 1 of 1 slices shown (2 of 8)]
[im 1/1]
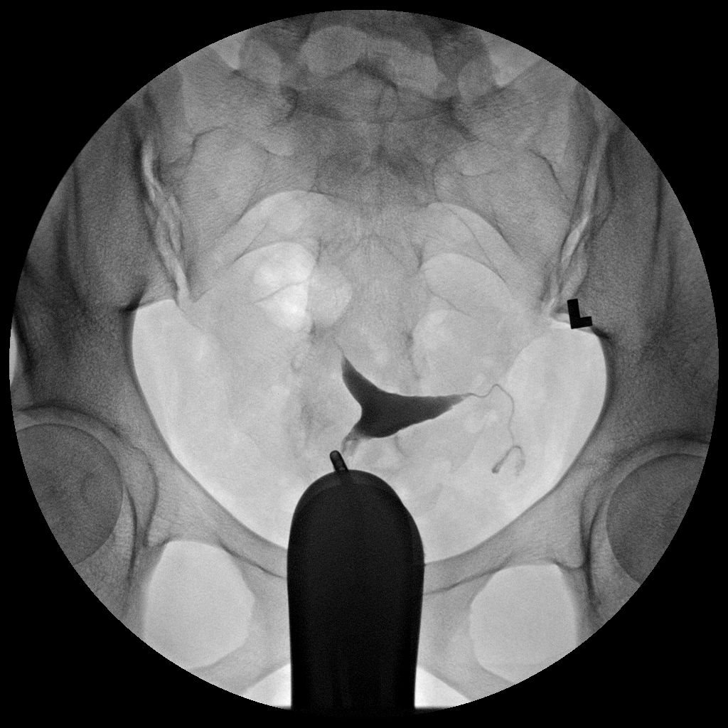

[Series 3: run · 1 of 1 slices shown (3 of 8)]
[im 1/1]
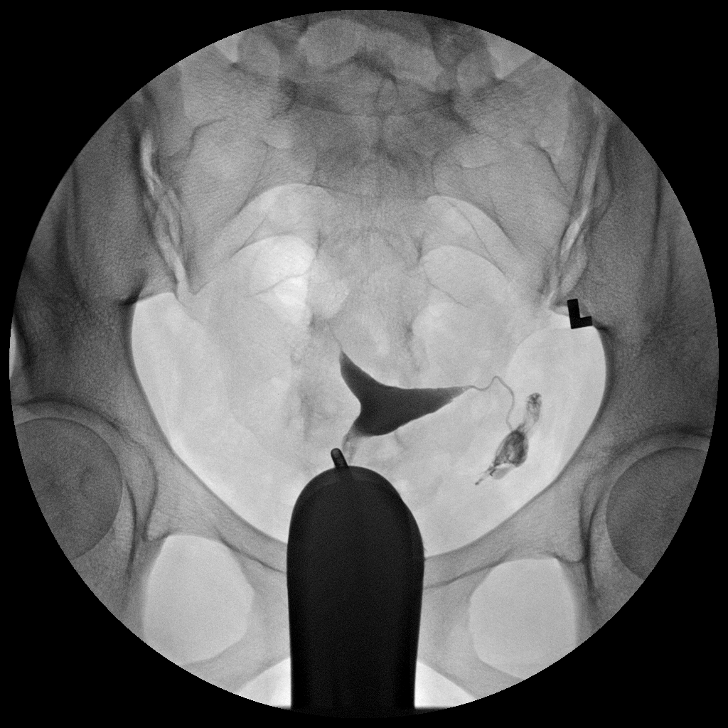

[Series 4: run · 1 of 1 slices shown (4 of 8)]
[im 1/1]
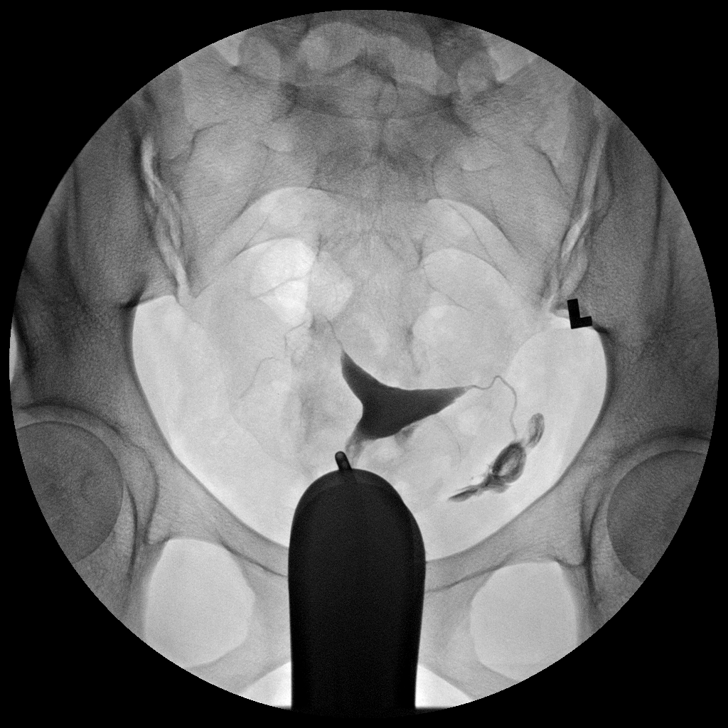

[Series 5: run · 1 of 1 slices shown (5 of 8)]
[im 1/1]
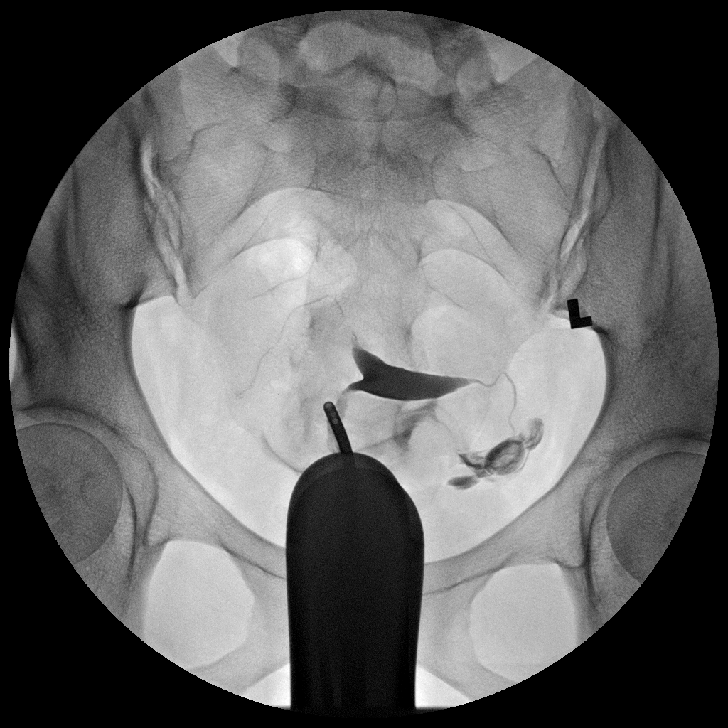

[Series 6: run · 1 of 1 slices shown (6 of 8)]
[im 1/1]
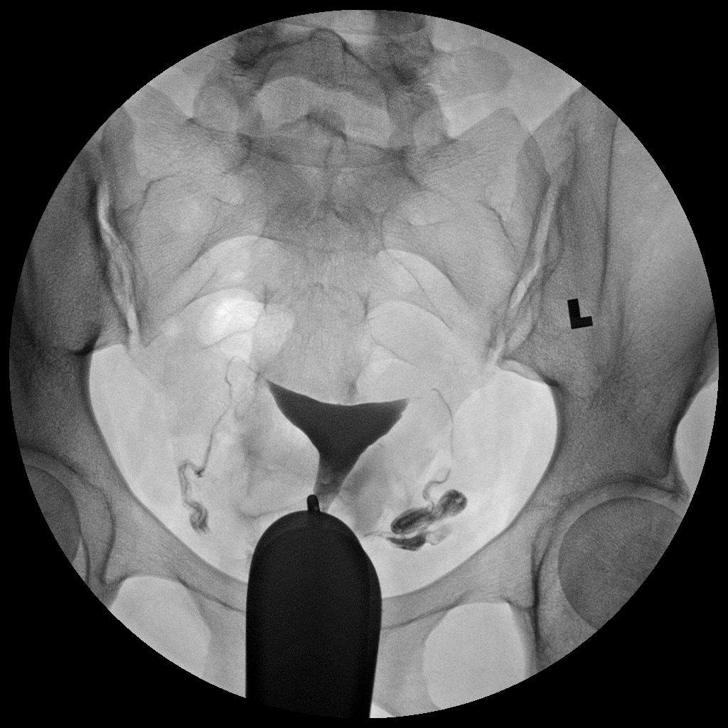

[Series 7: run · 1 of 1 slices shown (7 of 8)]
[im 1/1]
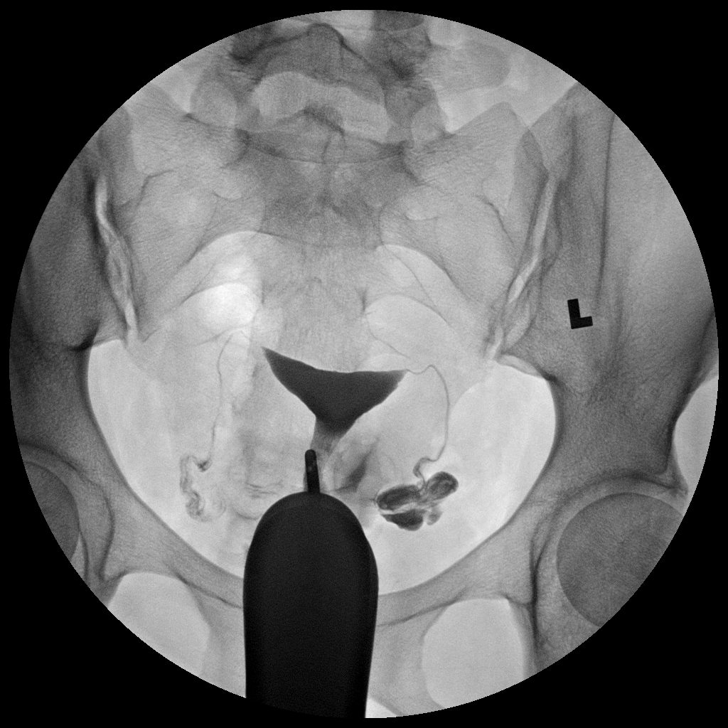

[Series 8: run · 1 of 1 slices shown (8 of 8)]
[im 1/1]
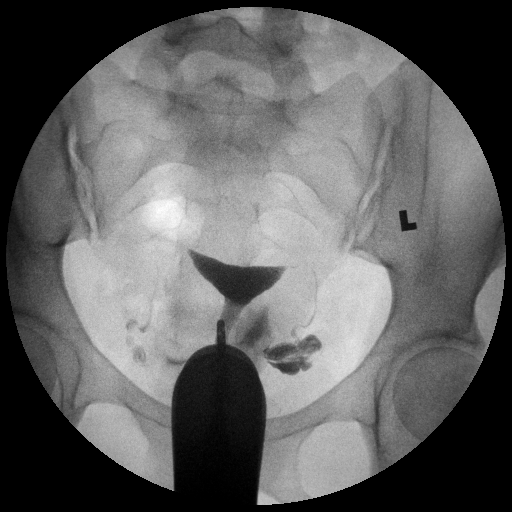

[8 of 8 positions shown; findings below may reference images not displayed]

FINDINGS: A normal endometrial morphology is seen.  Both fallopian
tubes demonstrate a normal morphology and bilateral free
intraperitoneal spill is noted.  No evidence for loculation of
contrast is seen on either side to suggest the presence of
peritubal or periovarian adhesions.
IMPRESSION: Normal HSG.

Approximately 20 ml of Omnipaque 300% was utilized for this exam.

## 2014-06-22 ENCOUNTER — Encounter (HOSPITAL_COMMUNITY): Payer: Self-pay | Admitting: *Deleted

## 2015-02-18 ENCOUNTER — Other Ambulatory Visit: Payer: Self-pay | Admitting: Gynecology

## 2015-02-19 LAB — CYTOLOGY - PAP

## 2020-08-25 LAB — RESULTS CONSOLE HPV: CHL HPV: NEGATIVE

## 2020-08-25 LAB — HM PAP SMEAR: HM Pap smear: NEGATIVE

## 2021-09-28 ENCOUNTER — Other Ambulatory Visit: Payer: Self-pay

## 2021-09-28 ENCOUNTER — Ambulatory Visit (INDEPENDENT_AMBULATORY_CARE_PROVIDER_SITE_OTHER): Payer: 59 | Admitting: Physician Assistant

## 2021-09-28 ENCOUNTER — Encounter: Payer: Self-pay | Admitting: Physician Assistant

## 2021-09-28 VITALS — BP 108/69 | HR 63 | Temp 98.0°F | Ht 63.0 in | Wt 130.4 lb

## 2021-09-28 DIAGNOSIS — Z91030 Bee allergy status: Secondary | ICD-10-CM | POA: Diagnosis not present

## 2021-09-28 DIAGNOSIS — F439 Reaction to severe stress, unspecified: Secondary | ICD-10-CM | POA: Diagnosis not present

## 2021-09-28 DIAGNOSIS — R5383 Other fatigue: Secondary | ICD-10-CM

## 2021-09-28 DIAGNOSIS — J4520 Mild intermittent asthma, uncomplicated: Secondary | ICD-10-CM

## 2021-09-28 DIAGNOSIS — E063 Autoimmune thyroiditis: Secondary | ICD-10-CM | POA: Diagnosis not present

## 2021-09-28 LAB — CBC WITH DIFFERENTIAL/PLATELET
Basophils Absolute: 0 10*3/uL (ref 0.0–0.1)
Basophils Relative: 0.7 % (ref 0.0–3.0)
Eosinophils Absolute: 0.1 10*3/uL (ref 0.0–0.7)
Eosinophils Relative: 1 % (ref 0.0–5.0)
HCT: 42.9 % (ref 36.0–46.0)
Hemoglobin: 14.2 g/dL (ref 12.0–15.0)
Lymphocytes Relative: 25.7 % (ref 12.0–46.0)
Lymphs Abs: 1.4 10*3/uL (ref 0.7–4.0)
MCHC: 33.1 g/dL (ref 30.0–36.0)
MCV: 92.9 fl (ref 78.0–100.0)
Monocytes Absolute: 0.4 10*3/uL (ref 0.1–1.0)
Monocytes Relative: 6.9 % (ref 3.0–12.0)
Neutro Abs: 3.5 10*3/uL (ref 1.4–7.7)
Neutrophils Relative %: 65.7 % (ref 43.0–77.0)
Platelets: 293 10*3/uL (ref 150.0–400.0)
RBC: 4.62 Mil/uL (ref 3.87–5.11)
RDW: 12.5 % (ref 11.5–15.5)
WBC: 5.4 10*3/uL (ref 4.0–10.5)

## 2021-09-28 LAB — COMPREHENSIVE METABOLIC PANEL
ALT: 11 U/L (ref 0–35)
AST: 18 U/L (ref 0–37)
Albumin: 4.6 g/dL (ref 3.5–5.2)
Alkaline Phosphatase: 39 U/L (ref 39–117)
BUN: 16 mg/dL (ref 6–23)
CO2: 30 mEq/L (ref 19–32)
Calcium: 9.5 mg/dL (ref 8.4–10.5)
Chloride: 104 mEq/L (ref 96–112)
Creatinine, Ser: 0.67 mg/dL (ref 0.40–1.20)
GFR: 107.09 mL/min (ref >60.00)
Glucose, Bld: 90 mg/dL (ref 70–99)
Potassium: 4.5 mEq/L (ref 3.5–5.1)
Sodium: 143 mEq/L (ref 135–145)
Total Bilirubin: 0.5 mg/dL (ref 0.2–1.2)
Total Protein: 7.2 g/dL (ref 6.0–8.3)

## 2021-09-28 LAB — VITAMIN D 25 HYDROXY (VIT D DEFICIENCY, FRACTURES): VITD: 55.56 ng/mL (ref 30.00–100.00)

## 2021-09-28 LAB — T4, FREE: Free T4: 0.69 ng/dL (ref 0.60–1.60)

## 2021-09-28 LAB — VITAMIN B12: Vitamin B-12: 531 pg/mL (ref 211–911)

## 2021-09-28 LAB — TSH: TSH: 2.47 u[IU]/mL (ref 0.35–5.50)

## 2021-09-28 LAB — T3, FREE: T3, Free: 3.1 pg/mL (ref 2.3–4.2)

## 2021-09-28 MED ORDER — EPINEPHRINE 0.3 MG/0.3ML IJ SOAJ: 0.3000 mg | INTRAMUSCULAR | 0 refills | Status: AC | PRN

## 2021-09-28 MED ORDER — ALBUTEROL SULFATE HFA 108 (90 BASE) MCG/ACT IN AERS: 2.0000 | INHALATION_SPRAY | Freq: Four times a day (QID) | RESPIRATORY_TRACT | 2 refills | Status: AC | PRN

## 2021-09-28 NOTE — Patient Instructions (Signed)
Good to meet you today! Please go to the lab for blood work and I will send results through [place].   See you back in 6 months. Call sooner if any concerns.

## 2021-09-28 NOTE — Progress Notes (Signed)
Subjective:    Patient ID: Stacy [name], female    DOB: 05-30-1976, 46 y.o.   MRN: [id number]  Chief Complaint  Patient presents with   Establish Care   Medication Refill    HPI 46 y.o. patient presents today for new patient establishment with me.  Patient was previously established with Dr. Jonni Sanger 4-5 years ago.  Current Care Team: Gyn (Endocrinology - quit going in 2019)   Acute Concerns: Bee allergy - needs epi-pen refilled  Difficulty sleeping -takes melatonin 1-2 nights per week; walks daily; works out at least 3 times per week. Feels exhausted during the day.   Taking care of three children and mother with Alzheimer's.   Chronic Concerns: See PMH listed below, as well as A/P for details on issues we specifically discussed during today's visit.      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    Past Surgical History:  Procedure Laterality Date   LAPAROSCOPY     LEFT OOPHORECTOMY     MOUTH SURGERY      Family History  Problem Relation Age of Onset   Osteopenia Mother    Hyperlipidemia Mother    Alzheimer's disease Mother    Other Father        primary progressive aphasia   Frontotemporal dementia Father    Alcohol abuse Brother    Alcohol abuse Maternal Grandfather    Heart disease Maternal Grandfather    Diverticulitis Maternal Grandfather    Parkinson's disease Maternal Grandfather    COPD Paternal Grandmother    Fibroids Paternal Grandmother    Cancer Paternal Grandmother        lung   Alcohol abuse Paternal Grandfather    Endometriosis Maternal Aunt    Alcohol abuse Paternal Uncle     Social History   Tobacco Use   Smoking status: Never   Smokeless tobacco: Never  Substance Use Topics    Alcohol use: No   Drug use: No     Allergies  Allergen Reactions   Bee Pollen Swelling   Erythromycin Nausea Only   Penicillins Rash    Review of Systems NEGATIVE UNLESS OTHERWISE INDICATED IN HPI      Objective:     BP 108/69    Pulse 63    Temp 98 F (36.7 C)    Ht 5\' 3"  (1.6 m)    Wt 130 lb 6.1 oz (59.1 kg)    SpO2 98%    BMI 23.10 kg/m   Wt Readings from Last 3 Encounters:  09/28/21 130 lb 6.1 oz (59.1 kg)  06/19/12 153 lb (69.4 kg)    BP Readings from Last 3 Encounters:  09/28/21 108/69  06/21/12 103/67  06/14/12 112/71     Physical Exam Vitals and nursing note reviewed.  Constitutional:      Appearance: Normal appearance. She is normal weight. She is not toxic-appearing.  HENT:     Head: Normocephalic and atraumatic.     Right Ear: Tympanic membrane, ear canal and external ear normal.     Left Ear: Tympanic membrane, ear canal and external ear normal.  Nose: Nose normal.     Mouth/Throat:     Mouth: Mucous membranes are moist.  Eyes:     Extraocular Movements: Extraocular movements intact.     Conjunctiva/sclera: Conjunctivae normal.     Pupils: Pupils are equal, round, and reactive to light.  Cardiovascular:     Rate and Rhythm: Normal rate and regular rhythm.     Pulses: Normal pulses.     Heart sounds: Normal heart sounds.  Pulmonary:     Effort: Pulmonary effort is normal.     Breath sounds: Normal breath sounds.  Musculoskeletal:        General: Normal range of motion.     Cervical back: Normal range of motion and neck supple.  Skin:    General: Skin is warm and dry.  Neurological:     General: No focal deficit present.     Mental Status: She is alert and oriented to person, place, and time.  Psychiatric:        Mood and Affect: Mood normal.        Behavior: Behavior normal.        Thought Content: Thought content normal.        Judgment: Judgment normal.       Assessment & Plan:   Problem List Items Addressed This Visit    None Visit Diagnoses     Hashimoto's disease    -  Primary   Relevant Orders   Iron, TIBC and Ferritin Panel (Completed)   VITAMIN D 25 Hydroxy (Vit-D Deficiency, Fractures) (Completed)   Vitamin B12 (Completed)   TSH (Completed)   Comprehensive metabolic panel (Completed)   CBC with Differential/Platelet (Completed)   Thyroid Peroxidase Antibodies (TPO) (REFL) (Completed)   T3, free (Completed)   T4, free (Completed)   Other fatigue       Relevant Orders   Iron, TIBC and Ferritin Panel (Completed)   VITAMIN D 25 Hydroxy (Vit-D Deficiency, Fractures) (Completed)   Vitamin B12 (Completed)   TSH (Completed)   Comprehensive metabolic panel (Completed)   CBC with Differential/Platelet (Completed)   Thyroid Peroxidase Antibodies (TPO) (REFL) (Completed)   T3, free (Completed)   T4, free (Completed)   Stress at home       Relevant Orders   Iron, TIBC and Ferritin Panel (Completed)   VITAMIN D 25 Hydroxy (Vit-D Deficiency, Fractures) (Completed)   Vitamin B12 (Completed)   TSH (Completed)   Comprehensive metabolic panel (Completed)   CBC with Differential/Platelet (Completed)   Thyroid Peroxidase Antibodies (TPO) (REFL) (Completed)   T3, free (Completed)   T4, free (Completed)   Bee allergy status       Mild intermittent asthma in adult without complication       Relevant Medications   albuterol (VENTOLIN HFA) 108 (90 Base) MCG/ACT inhaler        Meds ordered this encounter  Medications   albuterol (VENTOLIN HFA) 108 (90 Base) MCG/ACT inhaler    Sig: Inhale 2 puffs into the lungs every 6 (six) hours as needed for wheezing or shortness of breath.    Dispense:  8 g    Refill:  2   EPINEPHrine 0.3 mg/0.3 mL IJ SOAJ injection    Sig: Inject 0.3 mg into the muscle as needed for anaphylaxis.    Dispense:  1 each    Refill:  0    1. Hashimoto's disease 2. Other fatigue -Recheck labs as ordered, treat pending results   3. Stress at home -I think stress is  contributing  to her fatigue she is describing. She has a lot on her plate. Encouraged her to look into counseling & to keep up good work with healthy lifestyle.  4. Bee allergy status -So stated; Epi-pen refilled for her   5. Mild intermittent asthma in adult without complication -Usually related to allergies / exercise. Refilled rescue inhaler to use prn.   F/up in 6 months or prn    Shakela Donati M Kimble Delaurentis, PA-C

## 2021-09-29 LAB — IRON,TIBC AND FERRITIN PANEL
%SAT: 44 % (calc) (ref 16–45)
Ferritin: 60 ng/mL (ref 16–232)
Iron: 141 ug/dL (ref 40–190)
TIBC: 323 mcg/dL (calc) (ref 250–450)

## 2021-09-29 LAB — THYROID PEROXIDASE ANTIBODIES (TPO) (REFL): Thyroperoxidase Ab SerPl-aCnc: 1 IU/mL (ref <9)

## 2021-11-23 LAB — HM MAMMOGRAPHY

## 2022-03-28 ENCOUNTER — Ambulatory Visit: Payer: Self-pay | Admitting: Physician Assistant

## 2022-04-19 ENCOUNTER — Ambulatory Visit: Payer: Self-pay | Admitting: Physician Assistant

## 2022-05-03 ENCOUNTER — Ambulatory Visit: Payer: 59 | Admitting: Physician Assistant

## 2022-05-03 ENCOUNTER — Encounter: Payer: Self-pay | Admitting: Physician Assistant

## 2022-05-03 VITALS — BP 100/70 | HR 66 | Temp 98.0°F | Ht 63.0 in | Wt 137.8 lb

## 2022-05-03 DIAGNOSIS — R5383 Other fatigue: Secondary | ICD-10-CM | POA: Diagnosis not present

## 2022-05-03 DIAGNOSIS — Z13220 Encounter for screening for lipoid disorders: Secondary | ICD-10-CM

## 2022-05-03 DIAGNOSIS — R61 Generalized hyperhidrosis: Secondary | ICD-10-CM | POA: Diagnosis not present

## 2022-05-03 DIAGNOSIS — D485 Neoplasm of uncertain behavior of skin: Secondary | ICD-10-CM | POA: Diagnosis not present

## 2022-05-03 DIAGNOSIS — Z23 Encounter for immunization: Secondary | ICD-10-CM

## 2022-05-03 DIAGNOSIS — N926 Irregular menstruation, unspecified: Secondary | ICD-10-CM | POA: Diagnosis not present

## 2022-05-03 DIAGNOSIS — F439 Reaction to severe stress, unspecified: Secondary | ICD-10-CM

## 2022-05-03 LAB — POCT URINE PREGNANCY: Preg Test, Ur: NEGATIVE

## 2022-05-03 LAB — COMPREHENSIVE METABOLIC PANEL
ALT: 12 U/L (ref 0–35)
AST: 18 U/L (ref 0–37)
Albumin: 4.3 g/dL (ref 3.5–5.2)
Alkaline Phosphatase: 41 U/L (ref 39–117)
BUN: 16 mg/dL (ref 6–23)
CO2: 28 mEq/L (ref 19–32)
Calcium: 9 mg/dL (ref 8.4–10.5)
Chloride: 103 mEq/L (ref 96–112)
Creatinine, Ser: 0.72 mg/dL (ref 0.40–1.20)
GFR: 102.02 mL/min (ref >60.00)
Glucose, Bld: 91 mg/dL (ref 70–99)
Potassium: 4.2 mEq/L (ref 3.5–5.1)
Sodium: 138 mEq/L (ref 135–145)
Total Bilirubin: 0.6 mg/dL (ref 0.2–1.2)
Total Protein: 7.2 g/dL (ref 6.0–8.3)

## 2022-05-03 LAB — LIPID PANEL
Cholesterol: 139 mg/dL (ref 0–200)
HDL: 48.5 mg/dL (ref >39.00)
LDL Cholesterol: 79 mg/dL (ref 0–99)
NonHDL: 90.26
Total CHOL/HDL Ratio: 3
Triglycerides: 56 mg/dL (ref 0.0–149.0)
VLDL: 11.2 mg/dL (ref 0.0–40.0)

## 2022-05-03 LAB — POCT URINALYSIS DIPSTICK
Appearance: NORMAL
Bilirubin, UA: NEGATIVE
Blood, UA: NEGATIVE
Glucose, UA: NEGATIVE
Ketones, UA: NEGATIVE
Leukocytes, UA: NEGATIVE
Nitrite, UA: NEGATIVE
Protein, UA: NEGATIVE
Spec Grav, UA: 1.01 (ref 1.010–1.025)
Urobilinogen, UA: 0.2 E.U./dL
pH, UA: 6.5 (ref 5.0–8.0)

## 2022-05-03 LAB — VITAMIN D 25 HYDROXY (VIT D DEFICIENCY, FRACTURES): VITD: 57.34 ng/mL (ref 30.00–100.00)

## 2022-05-03 LAB — CBC WITH DIFFERENTIAL/PLATELET
Basophils Absolute: 0.1 10*3/uL (ref 0.0–0.1)
Basophils Relative: 1.1 % (ref 0.0–3.0)
Eosinophils Absolute: 0.1 10*3/uL (ref 0.0–0.7)
Eosinophils Relative: 1.4 % (ref 0.0–5.0)
HCT: 41.6 % (ref 36.0–46.0)
Hemoglobin: 14.1 g/dL (ref 12.0–15.0)
Lymphocytes Relative: 26.3 % (ref 12.0–46.0)
Lymphs Abs: 1.4 10*3/uL (ref 0.7–4.0)
MCHC: 33.8 g/dL (ref 30.0–36.0)
MCV: 92.3 fl (ref 78.0–100.0)
Monocytes Absolute: 0.4 10*3/uL (ref 0.1–1.0)
Monocytes Relative: 7.5 % (ref 3.0–12.0)
Neutro Abs: 3.5 10*3/uL (ref 1.4–7.7)
Neutrophils Relative %: 63.7 % (ref 43.0–77.0)
Platelets: 261 10*3/uL (ref 150.0–400.0)
RBC: 4.5 Mil/uL (ref 3.87–5.11)
RDW: 12.2 % (ref 11.5–15.5)
WBC: 5.5 10*3/uL (ref 4.0–10.5)

## 2022-05-03 LAB — VITAMIN B12: Vitamin B-12: 413 pg/mL (ref 211–911)

## 2022-05-03 NOTE — Patient Instructions (Addendum)
Great to see you today! Please schedule mole removal at your convenience.   Labs today   [facility]-Patients can call [phone] to schedule. Referral to Dr. Theodis Shove.  Urine all negative.  See you back in around 3 months, sooner if negative.

## 2022-05-03 NOTE — Progress Notes (Signed)
Subjective:    Patient ID: Stacy [name], female    DOB: 28-Aug-1975, 46 y.o.   MRN: [id number]  Chief Complaint  Patient presents with   Follow-up    Wants bloodwork done.     HPI Patient is in today for 6 month follow-up.  Energy is "zapped." Worse in the last month. Not sure if stress or something else.  3rd year dealing with mom's diagnosis of AD. Three children. Brother had liver failure earlier this year. Aunt committed suicide this month. Better sleep lately, still having some restless nights. Has a good routine at nighttime. Significantly better sleep than January. Still not understanding why she has no energy. Falling asleep in car-rider line. Avid exerciser. Night sweat & staying very hungry despite good nutrition. Not really having menstrual cycles - Mirena. Significant bleeding about 2 weeks ago though. Bowels normal. No CP or SOB. No abdominal pain, nausea or vomiting. No urinary symptoms.   Mammogram and pap smear UTD.   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    Past Surgical History:  Procedure Laterality Date   LAPAROSCOPY     LEFT OOPHORECTOMY     MOUTH SURGERY      Family History  Problem Relation Age of Onset   Osteopenia Mother    Hyperlipidemia Mother    Alzheimer's disease Mother    Other Father        primary progressive aphasia   Frontotemporal dementia Father    Alcohol abuse Brother    Liver disease Brother    Alcohol abuse Maternal Grandfather    Heart disease Maternal Grandfather    Diverticulitis Maternal Grandfather    Parkinson's disease Maternal Grandfather    COPD Paternal Grandmother    Fibroids Paternal Grandmother    Cancer Paternal Grandmother        lung   Alcohol abuse  Paternal Grandfather    Endometriosis Maternal Aunt    Alcohol abuse Paternal Uncle     Social History   Tobacco Use   Smoking status: Never   Smokeless tobacco: Never  Substance Use Topics   Alcohol use: No   Drug use: No     Allergies  Allergen Reactions   Bee Pollen Swelling   Erythromycin Nausea Only   Penicillins Rash    Review of Systems NEGATIVE UNLESS OTHERWISE INDICATED IN HPI      Objective:     BP 100/70   Pulse 66   Temp 98 F (36.7 C)   Ht '5\' 3"'$  (1.6 m)   Wt 137 lb 12.8 oz (62.5 kg)   LMP 04/24/2022   SpO2 97%   BMI 24.41 kg/m   Wt Readings from Last 3 Encounters:  05/03/22 137 lb 12.8 oz (62.5 kg)  09/28/21 130 lb 6.1 oz (59.1 kg)  06/19/12 153 lb (69.4 kg)    BP Readings from Last 3 Encounters:  05/03/22 100/70  09/28/21 108/69  06/21/12 103/67     Physical Exam Vitals and nursing note reviewed.  Constitutional:      Appearance: Normal appearance. She is normal weight. She is not toxic-appearing.  HENT:  Head: Normocephalic and atraumatic.     Right Ear: Tympanic membrane, ear canal and external ear normal.     Left Ear: Tympanic membrane, ear canal and external ear normal.     Nose: Nose normal.     Mouth/Throat:     Mouth: Mucous membranes are moist.  Eyes:     Extraocular Movements: Extraocular movements intact.     Conjunctiva/sclera: Conjunctivae normal.     Pupils: Pupils are equal, round, and reactive to light.  Cardiovascular:     Rate and Rhythm: Normal rate and regular rhythm.     Pulses: Normal pulses.     Heart sounds: Normal heart sounds.  Pulmonary:     Effort: Pulmonary effort is normal.     Breath sounds: Normal breath sounds.  Abdominal:     General: Abdomen is flat. Bowel sounds are normal.     Palpations: Abdomen is soft.  Musculoskeletal:        General: Normal range of motion.     Cervical back: Normal range of motion and neck supple.  Skin:    General: Skin is warm and dry.     Findings: No  rash.     Comments: Left upper back slight raised brown nevi with hazy borders  Neurological:     General: No focal deficit present.     Mental Status: She is alert and oriented to person, place, and time.  Psychiatric:        Mood and Affect: Mood normal.        Behavior: Behavior normal.        Thought Content: Thought content normal.        Judgment: Judgment normal.        Assessment & Plan:  Other fatigue -     VITAMIN D 25 Hydroxy (Vit-D Deficiency, Fractures) -     Vitamin B12 -     Comprehensive metabolic panel -     CBC with Differential/Platelet -     Lipid panel -     Thyroid Panel With TSH -     POCT urinalysis dipstick -     POCT urine pregnancy -     hCG, serum, qualitative  Irregular menstrual cycle -     VITAMIN D 25 Hydroxy (Vit-D Deficiency, Fractures) -     Vitamin B12 -     Comprehensive metabolic panel -     CBC with Differential/Platelet -     Lipid panel -     Thyroid Panel With TSH -     POCT urinalysis dipstick -     POCT urine pregnancy -     hCG, serum, qualitative  Night sweats -     VITAMIN D 25 Hydroxy (Vit-D Deficiency, Fractures) -     Vitamin B12 -     Comprehensive metabolic panel -     CBC with Differential/Platelet -     Lipid panel -     Thyroid Panel With TSH -     POCT urinalysis dipstick -     POCT urine pregnancy  Neoplasm of uncertain behavior of skin  Stress at home -     Ambulatory referral to Psychology  Screening for cholesterol level  Need for prophylactic vaccination with combined diphtheria-tetanus-pertussis (DTP) vaccine -     Tdap vaccine greater than or equal to 7yo IM  Need for immunization against influenza -     Flu Vaccine QUAD 60moIM (Fluarix, Fluzone & Alfiuria Quad PF)   Wide  differential for fatigue work-up - start with labs, treat pending any abnormal results. She does well with exercise and nutrition. Stress a huge factor in life ongoing - recommended referral to counseling and she's agreeable.  Reassured preg negative. Encouraged her to stay UTD with health maintenance. Consider sleep study.  Also plan for mole removal in office with me.   Flu and Tdap shots updated.     Return in about 3 months (around 08/02/2022) for recheck .  This note was prepared with assistance of Systems analyst. Occasional wrong-word or sound-a-like substitutions may have occurred due to the inherent limitations of voice recognition software.     Torryn Fiske M Cabell Lazenby, PA-C

## 2022-05-04 ENCOUNTER — Encounter: Payer: Self-pay | Admitting: Physician Assistant

## 2022-05-04 LAB — THYROID PANEL WITH TSH
Free Thyroxine Index: 2.3 (ref 1.4–3.8)
T3 Uptake: 35 % (ref 22–35)
T4, Total: 6.5 ug/dL (ref 5.1–11.9)
TSH: 2.05 mIU/L

## 2022-05-04 LAB — HCG, SERUM, QUALITATIVE: Preg, Serum: NEGATIVE

## 2022-06-05 ENCOUNTER — Ambulatory Visit (INDEPENDENT_AMBULATORY_CARE_PROVIDER_SITE_OTHER): Payer: 59 | Admitting: Behavioral Health

## 2022-06-05 DIAGNOSIS — Z636 Dependent relative needing care at home: Secondary | ICD-10-CM

## 2022-06-05 DIAGNOSIS — F4323 Adjustment disorder with mixed anxiety and depressed mood: Secondary | ICD-10-CM

## 2022-06-05 NOTE — Progress Notes (Signed)
                Stacy [name] L Stacy Bellin, LMFT 

## 2022-06-05 NOTE — Progress Notes (Signed)
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 Mother who resides in [place] & has Dx of Dementia  Mental Status Exam: Appearance:   Neat     Behavior:  Appropriate and Sharing  Motor:  Normal  Speech/Language:   Clear and Coherent and Normal Rate  Affect:  Appropriate  Mood:  normal  Thought process:  normal  Thought content:    WNL  Sensory/Perceptual disturbances:    WNL  Orientation:  oriented to person, place, and time/date  Attention:  Good  Concentration:  Good  Memory:  WNL  Fund of knowledge:   Good  Insight:    Good  Judgment:   Good  Impulse Control:  Good    Risk Assessment: Danger to Self:  No Self-injurious Behavior: No Danger to Others: No Duty to Warn:no Physical Aggression / Violence:No  Access to Firearms a concern: No  Gang Involvement:No  Patient / guardian was educated about steps to take if suicide or homicide risk level increases between visits: no; not needed today, Pt denies SI/SA, HI/HA While future psychiatric events cannot be accurately predicted, the patient does not currently require acute inpatient psychiatric care and does not currently meet [facility] involuntary commitment criteria.  Substance Abuse History: Current substance abuse: No     Past Psychiatric History:   No previous psychological problems have been observed Outpatient Providers: Alyssa Allwardt, PA-C History of Psych Hospitalization: No  Psychological Testing:  NA    Abuse History:  Victim of: No.,  NA    Report needed: No. Victim of Neglect:No. Perpetrator of  NA   Witness / Exposure to Domestic Violence: No    Protective Services Involvement: No  Witness to [employer] Violence:  No   Family History:  Family History  Problem Relation Age of Onset   Osteopenia Mother    Hyperlipidemia Mother    Alzheimer's disease Mother    Other Father        primary progressive aphasia   Frontotemporal dementia Father    Alcohol abuse Brother    Liver disease Brother    Alcohol abuse Maternal Grandfather    Heart disease Maternal Grandfather    Diverticulitis Maternal Grandfather    Parkinson's disease Maternal Grandfather    COPD Paternal Grandmother    Fibroids Paternal Grandmother    Cancer Paternal Grandmother        lung   Alcohol abuse Paternal Grandfather    Endometriosis Maternal Aunt    Alcohol abuse Paternal Uncle     Living situation: the patient lives with their family  Sexual Orientation: Straight  Relationship Status: married  Name of spouse / other: Public librarian If a parent, number of children / ages:10yo Dtr [place], 61yo Dtr [place], & 44yo Dtr [employer]  Support Systems: spouse friends Str Designer, television/film set & verbal support from younger [place]:  No   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 Mother is 66 of 24 Siblings, all local & 2 have died. Pt & her Str receive no support from this side of Family for Mother's care.   Marital or family conflict  - Pt is her Mother's [place]; Family does not understand their Sister's Dx of Dementia. Husb is in charge of Mother's [place] has taken advantage of her financial picture w/out sharing this  w/his Siblings. Pt believes he has done this for years.   Strengths: Supportive Relationships, Friends, Conservator, museum/gallery, Able to Communicate Effectively, and immediate Family; her Husb & 3 Dtrs & her Str Christina & her immed Family  Barriers:  None noted; Pt may find difficulty scheduling around her Caregiving routine for Family & Mother.    Legal History: Pending legal issue / charges:  Pt could possibly find herself in legal financial issues w/her older Dulce Sellar.. 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 8 g 2   Cholecalciferol (VITAMIN D-1000 MAX ST) 25 MCG (1000 UT) tablet Take by mouth.     EPINEPHrine 0.3 mg/0.3 mL IJ SOAJ injection INJECT 0.3 MG INTO THE MUSCLE AS NEEDED FOR ANAPHYLAXIS.     ibuprofen (ADVIL,MOTRIN) 800 MG tablet Take 1 tablet (800 mg total) by mouth every 8 (eight) hours as needed for pain. 40 tablet 1   levonorgestrel (MIRENA) 20 MCG/DAY IUD 1 each by Intrauterine route once.     loratadine (CLARITIN) 10 MG tablet 1 tablet Orally Once a day     melatonin 5 MG TABS 1 tablet at bedtime as needed with food Orally Once a day as needed     Multiple Vitamin (THERA) TABS Take 1 tablet by mouth daily.     Omega-3 Fatty Acids (FISH OIL) 1000 MG CAPS Take 1 capsule by mouth daily.     No current  facility-administered medications for this visit.    Allergies  Allergen Reactions   Bee Pollen Swelling   Erythromycin Nausea Only   Penicillins Rash    Diagnoses:  Adjustment disorder with mixed anxiety and depressed mood  Caregiver burden  Plan of Care: Josselyn will attend all scheduled sessions to care for herself & fortify her role of Caregiving.  Target Date: 08/05/2022  Progress: 1  Frequency: Twice monthly   Modality: Ethelda Chick sts she is feeling depleted, anxious & stressed over the situation w/her Mother's care @ [facility] & w/her Dx of Dementia. Pt will attend to her self-care needs & learn what this means. Provision of psychoedu  about Dementia Dx & Caregiver Fatigue.   Target Date: 08/05/2022  Progress: 2  Frequency:Twice monthly    Modality: Lacey Wallman describes an extensive extended Family on her Mother's side that are all local. These Aunts & Uncles all criticize her & her Str about their Caregiving, but never help or come by to visit. Pt will learn to keep her calm & solid boundaries w/her extended Family.   Target Date: 08/05/2022  Progress: 2  Frequency:Twice monthly   Modality: Boykin Reaper, LMFT

## 2022-06-21 ENCOUNTER — Ambulatory Visit (INDEPENDENT_AMBULATORY_CARE_PROVIDER_SITE_OTHER): Payer: 59 | Admitting: Behavioral Health

## 2022-06-21 DIAGNOSIS — F4323 Adjustment disorder with mixed anxiety and depressed mood: Secondary | ICD-10-CM | POA: Diagnosis not present

## 2022-06-21 DIAGNOSIS — Z636 Dependent relative needing care at home: Secondary | ICD-10-CM

## 2022-06-21 NOTE — Progress Notes (Signed)
[place] Counselor/Therapist Progress Note  Patient ID: Stacy [name], MRN: [id number],    Date: 06/21/2022  Time Spent: 40 min In Person @ [facility]   Treatment Type: Individual Therapy  Reported Symptoms: Pt levels of anx/dep have remained fairly steady & with the pending change in Daylight Savings Time, she is dreading the Sx of SAD she experiences.   Mental Status Exam: Appearance:  Casual     Behavior: Appropriate and Sharing  Motor: Normal  Speech/Language:  Clear and Coherent and Normal Rate  Affect: Appropriate  Mood: normal  Thought process: normal  Thought content:   WNL  Sensory/Perceptual disturbances:   WNL  Orientation: oriented to person, place, and time/date  Attention: Good  Concentration: Good  Memory: WNL  Fund of knowledge:  Good  Insight:   Good  Judgment:  Good  Impulse Control: Good   Risk Assessment: Danger to Self:  No Self-injurious Behavior: No Danger to Others: No Duty to Warn:no Physical Aggression / Violence:No  Access to Firearms a concern: No  Gang Involvement:No   Subjective: Pt is in relative good spirits today, exhausted & stressed to her normal limits due to being a Caregiver for her Mom who lives @ [place] & has Early-Onset Dementia. Pt cont's to coord daily w/her Str who shares in their Mother's care. Dulce Sellar visits their Mother twice monthly & causes upheaval as he does not possess a full understanding of Dementia.   Pt is trying to care for her health. She is exp'g hot flashes & finger swelling in the past month which makes her anxious due to the quick onset of the stiffness. Her OBGYN is testing for [facility] levels & for certain indicators of Arthritis. She will have these results in a few wks.  Pt cont's to feel deep into the sandwich generation as she manages her own Family of 3 Dtrs & a Husb, as well as the coord of care for her Mother in [facility].   Interventions: Solution-Oriented/Positive  Psychology, Grief Therapy, and Psycho-education/Bibliotherapy  Diagnosis:Adjustment disorder with mixed anxiety and depressed mood  Caregiver burden  Plan: Yaniris is trying to keep her mental health & wellbeing in sound condition. Psychotherapy is her only time for herself & she dreads the Fall & her onset of likely SAD. Pt has purchased a Light Box from [employer] to assist herself this Fall. Pt will report back on the results of using this next session.   Target Date: 07/05/2022  Progress: 0  Frequency: Twice monthly  Modality: Brooklynne Pereida is uncertain of this terrain w/her Mother's caregiving, but she has managed a lot in the past 2 yrs. She will start to recognize the successes more & try to keep the guilt to a minimum as she is doing her best in a very challenging situation.   Target Date: 07/21/2022  Progress: 3  Frequency: Twice monthly  Modality: Indiv Pt encouraged to bring her Str in during a session @ her comfort level & discretion. Pt grateful for this opportunity.   Donnetta Hutching, LMFT

## 2022-06-21 NOTE — Progress Notes (Signed)
                Grafton Warzecha L Vidal Lampkins, LMFT 

## 2022-07-05 ENCOUNTER — Ambulatory Visit (INDEPENDENT_AMBULATORY_CARE_PROVIDER_SITE_OTHER): Payer: 59 | Admitting: Behavioral Health

## 2022-07-05 DIAGNOSIS — Z636 Dependent relative needing care at home: Secondary | ICD-10-CM | POA: Diagnosis not present

## 2022-07-05 DIAGNOSIS — F4323 Adjustment disorder with mixed anxiety and depressed mood: Secondary | ICD-10-CM | POA: Diagnosis not present

## 2022-07-05 NOTE — Progress Notes (Signed)
                Anndee Connett L Charlean Carneal, LMFT 

## 2022-07-05 NOTE — Progress Notes (Signed)
[place] Counselor/Therapist Progress Note  Patient ID: Stacy [name], MRN: [id number],    Date: 07/05/2022  Time Spent: 80 min In Person @ [facility]   Treatment Type: Individual Therapy  Reported Symptoms: Elevated anx/dep due to the Holidays adding to regular stressors w/Caregiving of Mother that are already @ high levels. Family is causing addt'l stressors.   Mental Status Exam: Appearance:  Casual     Behavior: Appropriate and Sharing  Motor: Normal  Speech/Language:  Clear and Coherent and Normal Rate  Affect: Appropriate  Mood: normal  Thought process: normal  Thought content:   WNL  Sensory/Perceptual disturbances:   WNL  Orientation: oriented to person, place, and time/date  Attention: Good  Concentration: Good  Memory: WNL  Fund of knowledge:  Good  Insight:   Good  Judgment:  Good  Impulse Control: Good   Risk Assessment: Danger to Self:  No Self-injurious Behavior: No Danger to Others: No Duty to Warn:no Physical Aggression / Violence:No  Access to Firearms a concern: No  Gang Involvement:No   Subjective: Stacy [name] is trying to determine what is best for herself & her immediate Family with the pending Holiday season occurring. She is managing the caregiving duties of her Mother while she is in [facility], along w/her Str Christina. They both are visiting Mother regularly & managing the Caregivers they have hired to assist morning & evening.  Mother's Siblings remain uninvolved & this places addt'l pressure on Pt to navigate the communication & her responsibilities to everyone. Stacy [name] has decided it is best not to go to her Aunt's home for Thanksgiving since she will be placed under scrutiny she does not want to negotiate.   Interventions: Grief Therapy and Family Systems  Diagnosis:Adjustment disorder with mixed anxiety and depressed mood  Caregiver burden  Plan: Stacy [name] will remain steadfast in her respect for her immediate  Family's needs over the Holidays & not let her Mother's Siblings create turmoil, conflict, or a sense of her & Str not doing enough for their Mother.  Target Date: 08/04/2022  Progress: 7  Frequency: Twice monthly  Modality: Stacy [name] finds psychotherapy helpful to process all the events happening in the Family & the complex nature of her Mother's Dx of Early Onset Dementia. She will ask if her Str would like to attend next session.   Target Date: 07/26/2022  Progress: 0  Frequency: Twice monthly  Modality: Stacy Reaper, LMFT

## 2022-07-26 ENCOUNTER — Ambulatory Visit (INDEPENDENT_AMBULATORY_CARE_PROVIDER_SITE_OTHER): Payer: 59 | Admitting: Behavioral Health

## 2022-07-26 DIAGNOSIS — F4323 Adjustment disorder with mixed anxiety and depressed mood: Secondary | ICD-10-CM

## 2022-07-26 DIAGNOSIS — Z636 Dependent relative needing care at home: Secondary | ICD-10-CM

## 2022-07-26 NOTE — Progress Notes (Signed)
[place] Counselor/Therapist Progress Note  Patient ID: Stacy [name], MRN: [id number],    Date: 07/26/2022  Time Spent: 68 min In Person @ [facility]   Treatment Type: Individual Therapy  Reported Symptoms: Elevated anx/dep due to current frustrations w/Mother's care @ [place].  Mental Status Exam: Appearance:  Casual     Behavior: Appropriate and Sharing  Motor: Normal  Speech/Language:  Clear and Coherent  Affect: Appropriate  Mood: normal  Thought process: normal  Thought content:   WNL  Sensory/Perceptual disturbances:   WNL  Orientation: oriented to person, place, and time/date  Attention: Good  Concentration: Good  Memory: WNL  Fund of knowledge:  Good  Insight:   Good  Judgment:  Good  Impulse Control: Good   Risk Assessment: Danger to Self:  No Self-injurious Behavior: No Danger to Others: No Duty to Warn:no Physical Aggression / Violence:No  Access to Firearms a concern: No  Gang Involvement:No   Subjective: Pt is dealing w/the Holidays & inc'd Family involvement w/her Mother's care. Her Mother is currently dealing w/Early Onset Dementia & is refusing all types of personal & professional care in the current Facility where she resides. This is posing some Px issues for her Mother & the addt'l things that are happening due to her extended Family are also making care difficult.   Interventions: Solution-Oriented/Positive Psychology  Diagnosis:Adjustment disorder with mixed anxiety and depressed mood  Caregiver burden  Plan: Anahy cannot control what Family members are doing w/& for her Mother in the [place]. She is navigating Family members who are not respectful of her POA & this leaves her Mother in a precarious place if she wonders from the [place] it is not monitored. Shannon will call the Physician who rounds @ the Facility twice monthly & secure an updated report that is more helpful in determining  her Mother's cognitive decline status. Tyreisha will also research other Facilites that may suit her Mother's needs better.   Target Date: 09/04/2022  Progress: 4  Frequency: 1-2 times monthly depending on availability  Modality: Boykin Reaper, LMFT

## 2022-07-26 NOTE — Progress Notes (Signed)
                Alen Matheson L Anye Brose, LMFT 

## 2022-08-02 ENCOUNTER — Ambulatory Visit: Payer: 59 | Admitting: Physician Assistant

## 2022-08-30 ENCOUNTER — Ambulatory Visit: Payer: 59 | Admitting: Behavioral Health

## 2022-08-31 ENCOUNTER — Ambulatory Visit (INDEPENDENT_AMBULATORY_CARE_PROVIDER_SITE_OTHER): Payer: 59 | Admitting: Physician Assistant

## 2022-08-31 ENCOUNTER — Encounter: Payer: Self-pay | Admitting: Physician Assistant

## 2022-08-31 VITALS — BP 112/76 | HR 60 | Temp 97.3°F | Ht 63.0 in | Wt 141.6 lb

## 2022-08-31 DIAGNOSIS — R5383 Other fatigue: Secondary | ICD-10-CM | POA: Diagnosis not present

## 2022-08-31 DIAGNOSIS — M79641 Pain in right hand: Secondary | ICD-10-CM | POA: Diagnosis not present

## 2022-08-31 DIAGNOSIS — M25649 Stiffness of unspecified hand, not elsewhere classified: Secondary | ICD-10-CM

## 2022-08-31 DIAGNOSIS — M79642 Pain in left hand: Secondary | ICD-10-CM

## 2022-08-31 DIAGNOSIS — F439 Reaction to severe stress, unspecified: Secondary | ICD-10-CM

## 2022-08-31 DIAGNOSIS — R609 Edema, unspecified: Secondary | ICD-10-CM | POA: Diagnosis not present

## 2022-08-31 LAB — TSH: TSH: 2.84 u[IU]/mL (ref 0.35–5.50)

## 2022-08-31 LAB — SEDIMENTATION RATE: Sed Rate: 5 mm/hr (ref 0–20)

## 2022-08-31 MED ORDER — SPIRONOLACTONE 25 MG PO TABS: 25.0000 mg | ORAL_TABLET | Freq: Every day | ORAL | 0 refills | Status: DC

## 2022-08-31 NOTE — Progress Notes (Signed)
Subjective:    Patient ID: Stacy [name], female    DOB: 24-Feb-1976, 47 y.o.   MRN: [id number]  Chief Complaint  Patient presents with   Follow-up    Pt in for 3 mon f/u; after last appt finger joints started swelling all of a sudden, finger pain, bone pain, hx of thyroid issues hashimoto's and GYN asked to discuss at today's visit. Requested results from [facility] from last Pap;     HPI Patient is in today for recheck.   Swelling in hands starting in September for about 7-8 weeks. Feet felt tight as well. Started having significant hot flashes. As the day goes on, swelling goes down. Now having pain in joints of fingers.  Started taking Ashwaghanda and magnesium at night as recommended by GYN; this has been helping her sleep and with calming anxiety.  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    Past Surgical History:  Procedure Laterality Date   LAPAROSCOPY     LEFT OOPHORECTOMY     MOUTH SURGERY      Family History  Problem Relation Age of Onset   Osteopenia Mother    Hyperlipidemia Mother    Alzheimer's disease Mother    Other Father        primary progressive aphasia   Frontotemporal dementia Father    Alcohol abuse Brother    Liver disease Brother    Alcohol abuse Maternal Grandfather    Heart disease Maternal Grandfather    Diverticulitis Maternal Grandfather    Parkinson's disease Maternal Grandfather    COPD Paternal Grandmother    Fibroids Paternal Grandmother    Cancer Paternal Grandmother        lung   Alcohol abuse Paternal Grandfather    Endometriosis Maternal Aunt    Alcohol abuse Paternal Uncle     Social History   Tobacco Use   Smoking status: Never   Smokeless tobacco: Never  Substance  Use Topics   Alcohol use: No   Drug use: No     Allergies  Allergen Reactions   Bee Pollen Swelling   Erythromycin Nausea Only   Penicillins Rash    Review of Systems NEGATIVE UNLESS OTHERWISE INDICATED IN HPI      Objective:     BP 112/76 (BP Location: Left Arm)   Pulse 60   Temp (!) 97.3 F (36.3 C) (Temporal)   Ht '5\' 3"'$  (1.6 m)   Wt 141 lb 9.6 oz (64.2 kg)   SpO2 98%   BMI 25.08 kg/m   Wt Readings from Last 3 Encounters:  08/31/22 141 lb 9.6 oz (64.2 kg)  05/03/22 137 lb 12.8 oz (62.5 kg)  09/28/21 130 lb 6.1 oz (59.1 kg)    BP Readings from Last 3 Encounters:  08/31/22 112/76  05/03/22 100/70  09/28/21 108/69     Physical Exam Vitals and nursing note reviewed.  Constitutional:      Appearance: Normal appearance.  Cardiovascular:     Rate and Rhythm: Normal rate and regular rhythm.     Pulses: Normal pulses.     Heart sounds: No murmur heard. Pulmonary:  Effort: Pulmonary effort is normal.     Breath sounds: Normal breath sounds.  Musculoskeletal:     Comments: Mild swelling noting bilateral hands; minor degenerative changes noted in MCP and PIP joints small fingers  Neurological:     General: No focal deficit present.     Mental Status: She is alert and oriented to person, place, and time.  Psychiatric:        Mood and Affect: Mood normal.        Behavior: Behavior normal.        Assessment & Plan:  Bilateral hand pain Assessment & Plan: Early arthritis ? Perimenopausal changes ? Autoimmune ? Will check labs, imaging; treat pending abnormal results May trial spironolactone 25 mg to help with swelling; Pt aware of risks vs benefits and possible adverse reactions.   Orders: -     DG Hand Complete Left; Future -     DG Hand Complete Right; Future -     ANA, IFA Comprehensive Panel -     Rheumatoid factor -     Sedimentation rate -     TSH  Stiffness of hand joint, unspecified laterality -     DG Hand Complete Left; Future -     DG  Hand Complete Right; Future -     ANA, IFA Comprehensive Panel -     Rheumatoid factor -     Sedimentation rate -     TSH  Other fatigue -     ANA, IFA Comprehensive Panel -     Rheumatoid factor -     Sedimentation rate -     TSH  Swelling -     DG Hand Complete Left; Future -     DG Hand Complete Right; Future -     ANA, IFA Comprehensive Panel -     Rheumatoid factor -     Sedimentation rate -     TSH  Stress at home Assessment & Plan: No significant changes; mom with AD Went through counseling, not sure how helpful it was for her; needing more practical things to do and work on    Other orders -     Spironolactone; Take 1 tablet (25 mg total) by mouth daily.  Dispense: 30 tablet; Refill: 0      Return in about 6 months (around 03/01/2023) for recheck .      Siddarth Hsiung M Art Levan, PA-C

## 2022-09-01 LAB — ANA, IFA COMPREHENSIVE PANEL
Anti Nuclear Antibody (ANA): NEGATIVE
ENA SM Ab Ser-aCnc: <1 AI
SM/RNP: <1 AI
SSA (Ro) (ENA) Antibody, IgG: <1 AI
SSB (La) (ENA) Antibody, IgG: <1 AI
Scleroderma (Scl-70) (ENA) Antibody, IgG: <1 AI
ds DNA Ab: <1 IU/mL

## 2022-09-01 LAB — RHEUMATOID FACTOR: Rheumatoid fact SerPl-aCnc: <14 IU/mL (ref <14)

## 2022-09-03 DIAGNOSIS — M79641 Pain in right hand: Secondary | ICD-10-CM | POA: Insufficient documentation

## 2022-09-03 DIAGNOSIS — F439 Reaction to severe stress, unspecified: Secondary | ICD-10-CM | POA: Insufficient documentation

## 2022-09-03 NOTE — Assessment & Plan Note (Signed)
No significant changes; mom with AD Went through counseling, not sure how helpful it was for her; needing more practical things to do and work on

## 2022-09-03 NOTE — Progress Notes (Incomplete)
Subjective:    Patient ID: Stacy [name], female    DOB: 01-05-1976, 47 y.o.   MRN: [id number]  Chief Complaint  Patient presents with  . Follow-up    Pt in for 3 mon f/u; after last appt finger joints started swelling all of a sudden, finger pain, bone pain, hx of thyroid issues hashimoto's and GYN asked to discuss at today's visit.     HPI Patient is in today for recheck.   Swelling in hands starting in September for about 7-8 weeks. Feet felt tight as well. Started having significant hot flashes. As the day goes on, swelling goes down. Now having pain in joints of fingers.  Started taking Ashwaghanda and magnesium at night as recommended by GYN; this has been helping her sleep and with calming anxiety.  Past Medical History:  Diagnosis Date  . Asthma   . Breast mass    under R arm benign [place] x3 still there  . Eczema   . Endometriosis   . Fibroid    endometriomas x3  . Hx of mastitis   . Ovarian cyst   . SVD (spontaneous vaginal delivery) 06/19/2012  . 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 LAPAROSCOPY    . LEFT OOPHORECTOMY    . MOUTH SURGERY      Family History  Problem Relation Age of Onset  . Osteopenia Mother   . Hyperlipidemia Mother   . Alzheimer's disease Mother   . Other Father        primary progressive aphasia  . Frontotemporal dementia Father   . Alcohol abuse Brother   . Liver disease Brother   . Alcohol abuse Maternal Grandfather   . Heart disease Maternal Grandfather   . Diverticulitis Maternal Grandfather   . Parkinson's disease Maternal Grandfather   . COPD Paternal Grandmother   . Fibroids Paternal Grandmother   . Cancer Paternal Grandmother        lung  . Alcohol abuse Paternal Grandfather   . Endometriosis Maternal Aunt   . Alcohol abuse Paternal Uncle     Social History   Tobacco Use  . Smoking status: Never  . Smokeless tobacco: Never  Substance Use Topics  . Alcohol  use: No  . Drug use: No     Allergies  Allergen Reactions  . Bee Pollen Swelling  . Erythromycin Nausea Only  . Penicillins Rash    Review of Systems NEGATIVE UNLESS OTHERWISE INDICATED IN HPI      Objective:     BP 112/76 (BP Location: Left Arm)   Pulse 60   Temp (!) 97.3 F (36.3 C) (Temporal)   Ht '5\' 3"'$  (1.6 m)   Wt 141 lb 9.6 oz (64.2 kg)   SpO2 98%   BMI 25.08 kg/m   Wt Readings from Last 3 Encounters:  08/31/22 141 lb 9.6 oz (64.2 kg)  05/03/22 137 lb 12.8 oz (62.5 kg)  09/28/21 130 lb 6.1 oz (59.1 kg)    BP Readings from Last 3 Encounters:  08/31/22 112/76  05/03/22 100/70  09/28/21 108/69     Physical Exam Vitals and nursing note reviewed.  Constitutional:      Appearance: Normal appearance.  Cardiovascular:     Rate and Rhythm: Normal rate and regular rhythm.     Pulses: Normal pulses.     Heart sounds: No murmur heard. Pulmonary:     Effort: Pulmonary effort is normal.  Breath sounds: Normal breath sounds.  Neurological:     General: No focal deficit present.     Mental Status: She is alert and oriented to person, place, and time.  Psychiatric:        Mood and Affect: Mood normal.        Behavior: Behavior normal.        Assessment & Plan:  There are no diagnoses linked to this encounter.      No follow-ups on file.  This note was prepared with assistance of Systems analyst. Occasional wrong-word or sound-a-like substitutions may have occurred due to the inherent limitations of voice recognition software.  Time Spent: *** minutes of total time was spent on the date of the encounter performing the following actions: chart review prior to seeing the patient, obtaining history, performing a medically necessary exam, counseling on the treatment plan, placing orders, and documenting in our EHR.       Jayonna Meyering M Aaria Happ, PA-C

## 2022-09-03 NOTE — Assessment & Plan Note (Signed)
Early arthritis ? Perimenopausal changes ? Autoimmune ? Will check labs, imaging; treat pending abnormal results May trial spironolactone 25 mg to help with swelling; Pt aware of risks vs benefits and possible adverse reactions.

## 2022-09-06 ENCOUNTER — Ambulatory Visit (INDEPENDENT_AMBULATORY_CARE_PROVIDER_SITE_OTHER)
Admission: RE | Admit: 2022-09-06 | Discharge: 2022-09-06 | Disposition: A | Discharge status: Home / Self Care | Payer: 59 | Source: Ambulatory Visit | Attending: Physician Assistant | Admitting: Physician Assistant

## 2022-09-06 DIAGNOSIS — R609 Edema, unspecified: Secondary | ICD-10-CM | POA: Diagnosis not present

## 2022-09-06 DIAGNOSIS — M79641 Pain in right hand: Secondary | ICD-10-CM

## 2022-09-06 DIAGNOSIS — M25649 Stiffness of unspecified hand, not elsewhere classified: Secondary | ICD-10-CM

## 2022-09-06 DIAGNOSIS — M79642 Pain in left hand: Secondary | ICD-10-CM | POA: Diagnosis not present

## 2022-10-03 ENCOUNTER — Other Ambulatory Visit: Payer: Self-pay | Admitting: Physician Assistant

## 2022-10-31 ENCOUNTER — Encounter: Payer: Self-pay | Admitting: Physician Assistant

## 2023-06-06 ENCOUNTER — Encounter: Payer: Self-pay | Admitting: Physician Assistant
